# Patient Record
Sex: Female | Born: 1965 | Race: Black or African American | Hispanic: No | Marital: Married | Smoking: Current every day smoker
Health system: Southern US, Community
[De-identification: ages and names within clinical notes are randomized; demographics above are authoritative.]

## PROBLEM LIST (undated history)

## (undated) DIAGNOSIS — E119 Type 2 diabetes mellitus without complications: Secondary | ICD-10-CM

## (undated) DIAGNOSIS — I10 Essential (primary) hypertension: Secondary | ICD-10-CM

## (undated) HISTORY — DX: Type 2 diabetes mellitus without complications: E11.9

## (undated) HISTORY — PX: OTHER SURGICAL HISTORY: SHX169

---

## 2008-04-12 ENCOUNTER — Emergency Department (HOSPITAL_COMMUNITY): Admission: EM | Admit: 2008-04-12 | Discharge: 2008-04-12 | Payer: Self-pay | Admitting: Emergency Medicine

## 2008-11-06 ENCOUNTER — Emergency Department (HOSPITAL_COMMUNITY): Admission: EM | Admit: 2008-11-06 | Discharge: 2008-11-06 | Payer: Self-pay | Admitting: Emergency Medicine

## 2010-01-15 ENCOUNTER — Emergency Department (HOSPITAL_COMMUNITY): Admission: EM | Admit: 2010-01-15 | Discharge: 2010-01-15 | Payer: Self-pay | Admitting: Family Medicine

## 2010-05-03 ENCOUNTER — Other Ambulatory Visit: Payer: Self-pay | Admitting: Family Medicine

## 2010-05-03 DIAGNOSIS — Z1231 Encounter for screening mammogram for malignant neoplasm of breast: Secondary | ICD-10-CM

## 2010-05-10 ENCOUNTER — Ambulatory Visit: Payer: Self-pay

## 2010-05-18 ENCOUNTER — Ambulatory Visit
Admission: RE | Admit: 2010-05-18 | Discharge: 2010-05-18 | Disposition: A | Discharge status: Home / Self Care | Payer: BC Managed Care – PPO | Source: Ambulatory Visit | Attending: Family Medicine | Admitting: Family Medicine

## 2010-05-18 DIAGNOSIS — Z1231 Encounter for screening mammogram for malignant neoplasm of breast: Secondary | ICD-10-CM

## 2010-06-14 LAB — POCT I-STAT, CHEM 8
Calcium, Ion: 1.06 mmol/L — ABNORMAL LOW (ref 1.12–1.32)
Chloride: 109 mEq/L (ref 96–112)
HCT: 41 % (ref 36.0–46.0)
Hemoglobin: 13.9 g/dL (ref 12.0–15.0)
TCO2: 23 mmol/L (ref 0–100)

## 2010-06-14 LAB — POCT PREGNANCY, URINE: Preg Test, Ur: NEGATIVE

## 2010-10-17 ENCOUNTER — Encounter: Payer: Self-pay | Admitting: Internal Medicine

## 2010-10-17 DIAGNOSIS — Z0000 Encounter for general adult medical examination without abnormal findings: Secondary | ICD-10-CM | POA: Insufficient documentation

## 2010-10-18 ENCOUNTER — Ambulatory Visit: Payer: BC Managed Care – PPO | Admitting: Internal Medicine

## 2010-10-27 ENCOUNTER — Ambulatory Visit: Payer: BC Managed Care – PPO | Admitting: Internal Medicine

## 2010-10-27 DIAGNOSIS — Z029 Encounter for administrative examinations, unspecified: Secondary | ICD-10-CM

## 2011-02-23 ENCOUNTER — Other Ambulatory Visit (HOSPITAL_COMMUNITY): Payer: Self-pay | Admitting: Urology

## 2011-02-23 DIAGNOSIS — D497 Neoplasm of unspecified behavior of endocrine glands and other parts of nervous system: Secondary | ICD-10-CM

## 2011-03-02 ENCOUNTER — Ambulatory Visit (HOSPITAL_COMMUNITY)
Admission: RE | Admit: 2011-03-02 | Discharge: 2011-03-02 | Disposition: A | Discharge status: Home / Self Care | Payer: BC Managed Care – PPO | Source: Ambulatory Visit | Attending: Urology | Admitting: Urology

## 2011-03-02 DIAGNOSIS — D497 Neoplasm of unspecified behavior of endocrine glands and other parts of nervous system: Secondary | ICD-10-CM

## 2011-03-02 DIAGNOSIS — D3500 Benign neoplasm of unspecified adrenal gland: Secondary | ICD-10-CM | POA: Insufficient documentation

## 2011-03-02 MED ORDER — GADOBENATE DIMEGLUMINE 529 MG/ML IV SOLN
18.0000 mL | Freq: Once | INTRAVENOUS | Status: AC | PRN
  Administered 2011-03-02: 18 mL via INTRAVENOUS

## 2011-05-17 ENCOUNTER — Ambulatory Visit (INDEPENDENT_AMBULATORY_CARE_PROVIDER_SITE_OTHER): Payer: Managed Care, Other (non HMO) | Admitting: Family Medicine

## 2011-05-17 VITALS — BP 132/82 | HR 99 | Temp 98.6°F | Resp 16 | Ht 61.0 in | Wt 204.4 lb

## 2011-05-17 DIAGNOSIS — D2370 Other benign neoplasm of skin of unspecified lower limb, including hip: Secondary | ICD-10-CM

## 2011-05-17 DIAGNOSIS — L989 Disorder of the skin and subcutaneous tissue, unspecified: Secondary | ICD-10-CM

## 2011-05-17 MED ORDER — DOXYCYCLINE HYCLATE 100 MG PO TABS: 100.0000 mg | ORAL_TABLET | Freq: Two times a day (BID) | ORAL | Status: AC

## 2011-05-17 MED ORDER — TRIAMCINOLONE ACETONIDE 0.1 % EX CREA: TOPICAL_CREAM | Freq: Two times a day (BID) | CUTANEOUS | Status: AC

## 2011-05-17 NOTE — Progress Notes (Signed)
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  It has become painful and "puss-y".  O:  Crusty hyperpigmented irreg fissured lesion with mild surrounding erythema  A:  Possible varicosity, irritated with early cellulitis  P:  Doxy 100 bid Triamcinolone cr tid

## 2012-03-24 DIAGNOSIS — D3500 Benign neoplasm of unspecified adrenal gland: Secondary | ICD-10-CM | POA: Insufficient documentation

## 2012-03-24 DIAGNOSIS — F17200 Nicotine dependence, unspecified, uncomplicated: Secondary | ICD-10-CM | POA: Insufficient documentation

## 2012-04-24 ENCOUNTER — Other Ambulatory Visit: Payer: Self-pay | Admitting: Family Medicine

## 2012-04-24 DIAGNOSIS — Z1231 Encounter for screening mammogram for malignant neoplasm of breast: Secondary | ICD-10-CM

## 2012-05-23 ENCOUNTER — Ambulatory Visit: Payer: Managed Care, Other (non HMO)

## 2012-06-05 ENCOUNTER — Ambulatory Visit: Payer: Managed Care, Other (non HMO)

## 2012-08-14 DIAGNOSIS — E559 Vitamin D deficiency, unspecified: Secondary | ICD-10-CM | POA: Insufficient documentation

## 2012-09-06 ENCOUNTER — Ambulatory Visit
Admission: RE | Admit: 2012-09-06 | Discharge: 2012-09-06 | Disposition: A | Discharge status: Home / Self Care | Payer: 59 | Source: Ambulatory Visit | Attending: Family Medicine | Admitting: Family Medicine

## 2012-09-06 DIAGNOSIS — Z1231 Encounter for screening mammogram for malignant neoplasm of breast: Secondary | ICD-10-CM

## 2013-03-24 DIAGNOSIS — I10 Essential (primary) hypertension: Secondary | ICD-10-CM | POA: Insufficient documentation

## 2014-06-23 ENCOUNTER — Ambulatory Visit: Payer: Managed Care, Other (non HMO) | Admitting: Podiatry

## 2015-01-02 ENCOUNTER — Other Ambulatory Visit: Payer: Self-pay

## 2015-01-02 DIAGNOSIS — Z1231 Encounter for screening mammogram for malignant neoplasm of breast: Secondary | ICD-10-CM

## 2015-01-26 ENCOUNTER — Ambulatory Visit: Payer: Self-pay

## 2015-03-25 ENCOUNTER — Ambulatory Visit: Payer: Self-pay

## 2015-07-13 ENCOUNTER — Emergency Department (HOSPITAL_COMMUNITY)
Admission: EM | Admit: 2015-07-13 | Discharge: 2015-07-13 | Disposition: A | Discharge status: Home / Self Care | Payer: 59 | Attending: Emergency Medicine | Admitting: Emergency Medicine

## 2015-07-13 ENCOUNTER — Encounter (HOSPITAL_COMMUNITY): Payer: Self-pay | Admitting: Emergency Medicine

## 2015-07-13 DIAGNOSIS — H9202 Otalgia, left ear: Secondary | ICD-10-CM | POA: Insufficient documentation

## 2015-07-13 DIAGNOSIS — J029 Acute pharyngitis, unspecified: Secondary | ICD-10-CM | POA: Diagnosis present

## 2015-07-13 DIAGNOSIS — I10 Essential (primary) hypertension: Secondary | ICD-10-CM | POA: Insufficient documentation

## 2015-07-13 DIAGNOSIS — F17210 Nicotine dependence, cigarettes, uncomplicated: Secondary | ICD-10-CM | POA: Insufficient documentation

## 2015-07-13 HISTORY — DX: Essential (primary) hypertension: I10

## 2015-07-13 LAB — RAPID STREP SCREEN (MED CTR MEBANE ONLY): Streptococcus, Group A Screen (Direct): NEGATIVE

## 2015-07-13 MED ORDER — HYDROCODONE-ACETAMINOPHEN 7.5-325 MG/15ML PO SOLN: 15.0000 mL | Freq: Three times a day (TID) | ORAL | Status: DC | PRN

## 2015-07-13 MED ORDER — HYDROCODONE-ACETAMINOPHEN 7.5-325 MG/15ML PO SOLN
10.0000 mL | Freq: Once | ORAL | Status: AC
  Administered 2015-07-13: 10 mL via ORAL
  Filled 2015-07-13: qty 15

## 2015-07-13 NOTE — Discharge Instructions (Signed)
Take your medications as prescribed as needed for pain relief. He may also take 600 mg ibuprofen 4 times daily as needed for additional pain relief. Continue to drink at least six 8 ounce glasses of water daily to remain hydrated at home. Follow-up with your primary care provider in the next 5 days if her pain has not improved. Please return to the Emergency Department if symptoms worsen or new onset of uncontrollable fever, facial/neck swelling, unable to swallow resulting in drooling, difficulty breathing, muffled voice, unable to open jaw completely, neck stiffness.

## 2015-07-13 NOTE — ED Notes (Signed)
Pt states she is been having left ear ache and sore throat for the past few days, very hard to swallow and to speech. Pt denies any fever at this time.

## 2015-07-13 NOTE — ED Provider Notes (Signed)
CSN: [id number]     Arrival date & time 07/13/15  2044 History  By signing my name below, I, Dora Sims, attest that this documentation has been prepared under the direction and in the presence of non-physician practitioner, Harlene Ramus, PA-C. Electronically Signed: Dora Sims, Scribe. 07/13/2015. 9:19 PM.   Chief Complaint  Patient presents with  . Otalgia  . Sore Throat    The history is provided by the patient. No language interpreter was used.     HPI Comments: Samantha Rice is a 50 y.o. female with h/o HTN who presents to the Emergency Department complaining of sudden onset, constant, worsening, severe sore throat for the last week. She states that her sore throat has worsened significantly over the last three days. She notes associated left otalgia, intermittent sinus pressure, and dry cough since onset as well. She endorses pain exacerbation with swallowing, speaking, and opening of her mouth; pt is able to swallow and has not been spitting up saliva due to pain with swallowing. Pt states that she scheduled an appointment with her PCP but could not wait because her symptoms worsened. She does not believe that she has sick contacts with similar symptoms; she works at [employer]. She has been taking Tylenol with no relief. Pt denies SOB, fever, dental pain, headache, lightheadedness, dizziness, visual changes, left ear drainage, difficulty hearing, chest pain, wheezing, nausea, vomiting, rhinorrhea, congestion, facial/neck swelling, neck stiffness, or any other associated symptoms. Pt did not take her medication for HTN today because of pain exacerbation with swallowing.  Past Medical History  Diagnosis Date  . Hypertension    Past Surgical History  Procedure Laterality Date  . C cection     History reviewed. No pertinent family history. Social History  Substance Use Topics  . Smoking status: Current Every Day Smoker -- 0.50 packs/day    Types: Cigarettes  . Smokeless tobacco:  None  . Alcohol Use: No   OB History    No data available     Review of Systems  Constitutional: Negative for fever.  HENT: Positive for ear pain, sinus pressure, sore throat and trouble swallowing. Negative for congestion, dental problem, ear discharge, facial swelling, hearing loss and rhinorrhea.   Eyes: Negative for visual disturbance.  Respiratory: Positive for cough. Negative for shortness of breath and wheezing.   Cardiovascular: Negative for chest pain.  Gastrointestinal: Negative for nausea and vomiting.  Musculoskeletal: Negative for neck pain.  Neurological: Negative for dizziness, light-headedness and headaches.    Allergies  Naproxen  Home Medications   Prior to Admission medications   Medication Sig Start Date End Date Taking? Authorizing Provider  HYDROcodone-acetaminophen (HYCET) 7.5-325 mg/15 ml solution Take 15 mLs by mouth every 8 (eight) hours as needed for moderate pain. 07/13/15   Chesley Noon Nadeau, PA-C   BP 175/95 mmHg  Pulse 119  Temp(Src) 99.5 F (37.5 C) (Oral)  Resp 20  Ht 5\' 1"  (1.549 m)  Wt 86.183 kg  BMI 35.92 kg/m2  SpO2 100%  LMP 03/14/2014 Physical Exam  Constitutional: She is oriented to person, place, and time. She appears well-developed and well-nourished.  HENT:  Head: Normocephalic and atraumatic.  Right Ear: Tympanic membrane normal.  Left Ear: Tympanic membrane normal. No drainage, swelling or tenderness. No mastoid tenderness. No decreased hearing is noted.  Nose: Nose normal. Right sinus exhibits no maxillary sinus tenderness and no frontal sinus tenderness. Left sinus exhibits no maxillary sinus tenderness and no frontal sinus tenderness.  Mouth/Throat: Uvula is midline and  mucous membranes are normal. No trismus in the jaw. Normal dentition. No dental abscesses or uvula swelling. Oropharyngeal exudate (white exudate at bilateral tonsils), posterior oropharyngeal edema and posterior oropharyngeal erythema present. No  tonsillar abscesses.  No facial or neck swelling.  Eyes: Conjunctivae and EOM are normal. Right eye exhibits no discharge. Left eye exhibits no discharge. No scleral icterus.  Neck: Normal range of motion. Neck supple.  Cardiovascular: Regular rhythm, normal heart sounds and intact distal pulses.   Tachycardic heart rate: 119.  Pulmonary/Chest: Effort normal and breath sounds normal. No stridor. No respiratory distress. She has no wheezes. She has no rales. She exhibits no tenderness.  Lymphadenopathy:    She has cervical adenopathy (left submandibular tender to palpation).  Neurological: She is alert and oriented to person, place, and time.  Nursing note and vitals reviewed.   ED Course  Procedures (including critical care time)  DIAGNOSTIC STUDIES: Oxygen Saturation is 100% on RA, normal by my interpretation.    COORDINATION OF CARE: 9:19 PM Discussed treatment plan with pt at bedside and pt agreed to plan.  Labs Review Labs Reviewed  RAPID STREP SCREEN (NOT AT [facility])  CULTURE, GROUP A STREP [facility])    Imaging Review No results found. I have personally reviewed and evaluated these images and lab results as part of my medical decision-making.   EKG Interpretation None      MDM   Final diagnoses:  Pharyngitis    Pt afebrile with tonsillar exudate, cervical lymphadenopathy, & dysphagia; negative strep. I suspect patient's symptoms are likely due to viral pharyngitis. Treated in the Ed with Pain medication. Initial vitals, HR 119, afebrile, BP 175/95. Pt appears well hydrated, however discussed importance of water rehydration. Presentation non concerning for PTA or infxn spread to soft tissue. No trismus or uvula deviation. Specific return precautions discussed. Pt able to drink water in ED without difficulty with intact air way. Repeat vitals, HR palpated at 100. Pt reports her pain has improved. Plan to discharge patient home with symptomatic treatment including pain meds.  Recommended PCP follow up in 3-5 days if pain has not improved. Discussed return precautions with patient. Patient reports understanding and agreement of plan.   Patients blood pressure 175/95 on initial evaluation. Patient reports history of hypertension and denies taking her medications this morning. Patient denies headache, visual changes, lightheadedness, dizziness, shortness of breath, chest pain, abdominal pain, numbness, tingling, weakness. No signs of hypertensive emergency or urgency at this time. Advised patient to take her medications after being discharged from the ED today. Advised patient to follow up with her PCP in one week to have her blood pressure rechecked.   I personally performed the services described in this documentation, which was scribed in my presence. The recorded information has been reviewed and is accurate.   Chesley Noon [place], [place] 07/13/15 2259  Leonard Schwartz, MD 07/15/15 [phone]

## 2015-07-16 LAB — CULTURE, GROUP A STREP (THRC)

## 2016-02-15 DIAGNOSIS — M545 Low back pain, unspecified: Secondary | ICD-10-CM | POA: Insufficient documentation

## 2016-02-22 ENCOUNTER — Ambulatory Visit: Payer: 59 | Admitting: Physical Therapy

## 2016-02-23 ENCOUNTER — Ambulatory Visit: Payer: 59 | Attending: Family Medicine | Admitting: Physical Therapy

## 2016-02-23 DIAGNOSIS — R262 Difficulty in walking, not elsewhere classified: Secondary | ICD-10-CM | POA: Insufficient documentation

## 2016-02-23 DIAGNOSIS — M62830 Muscle spasm of back: Secondary | ICD-10-CM | POA: Diagnosis present

## 2016-02-23 DIAGNOSIS — M545 Low back pain, unspecified: Secondary | ICD-10-CM

## 2016-02-23 DIAGNOSIS — M25612 Stiffness of left shoulder, not elsewhere classified: Secondary | ICD-10-CM | POA: Insufficient documentation

## 2016-02-23 DIAGNOSIS — M25512 Pain in left shoulder: Secondary | ICD-10-CM | POA: Insufficient documentation

## 2016-02-23 DIAGNOSIS — G8929 Other chronic pain: Secondary | ICD-10-CM | POA: Diagnosis present

## 2016-02-23 NOTE — Therapy (Signed)
[address] [place], [place], [address] Phone: [phone]   Fax:  [phone]  Physical Therapy Evaluation  Patient Details  Name: Samantha Rice MRN: [id number] Date of Birth: [birth year] No Data Recorded  Encounter Date: 02/23/2016      PT End of Session - 02/23/16 1142    Visit Number 1   Date for PT Re-Evaluation 05/20/16   PT Start Time 1100   PT Stop Time 1155   PT Time Calculation (min) 55 min   Activity Tolerance Patient limited by pain   Behavior During Therapy Anxious      Past Medical History:  Diagnosis Date  . Hypertension     Past Surgical History:  Procedure Laterality Date  . c cection      There were no vitals filed for this visit.       Subjective Assessment - 02/23/16 1101    Subjective Pt reports that she has been experiencing low back pain for about 6 months. She also reports that the left shoulder has also ben huritng her for about 6 months. She can not recall a specific evenet that caused the pain. She reports that Xrays were negative on both body regions. She states that the pain is worse in the evening. She states that she wakes up with pain some nights. Last night she woke up twice due to the shoulder pain.    Limitations Sitting;Standing;House hold activities  washing dishes   How long can you sit comfortably? 15 minutes   How long can you stand comfortably? 30 minutes   How long can you walk comfortably? varies   Diagnostic tests Xrays- negative   Patient Stated Goals managing pain   Currently in Pain? Yes   Pain Score 4    Pain Location Back   Pain Orientation Right;Left;Lower   Pain Descriptors / Indicators Aching;Sharp   Pain Type Chronic pain   Pain Onset More than a month ago   Pain Frequency Intermittent   Aggravating Factors  sitting for long periods, standing/walking for long periods,    Pain Relieving Factors taking BCs or Goody powder   Multiple  Pain Sites Yes   Pain Score 6   Pain Location Shoulder   Pain Orientation Left;Anterior;Upper   Pain Descriptors / Indicators Sore;Tightness   Pain Type Chronic pain   Pain Onset More than a month ago   Pain Frequency Constant   Aggravating Factors  any type of motion causes an increase in pain, trying to open things with the left hand   Pain Relieving Factors nothing   Effect of Pain on Daily Activities normally uses right hand to avoid pain on left            [facility] PT Assessment - 02/23/16 0001      Assessment   Medical Diagnosis Dr. Luciana Axe   Hand Dominance Right   Next MD Visit Jan. 2   Prior Therapy no     Balance Screen   Has the patient fallen in the past 6 months No   Has the patient had a decrease in activity level because of a fear of falling?  No   Is the patient reluctant to leave their home because of a fear of falling?  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   Lumbar Extension WFL, P!   Lumbar - Right Side [facility], P!   Lumbar - Left Side Bend WFL, P!   Lumbar - Right Rotation WFL, P!   Lumbar - Left Rotation WFL, P!     Strength   Overall Strength Deficits                   [facility] Adult PT Treatment/Exercise - 02/23/16 0001      Modalities   Modalities Electrical Stimulation;Moist Heat     Moist Heat Therapy   Number Minutes Moist Heat 15 Minutes   Moist Heat Location Lumbar Spine;Shoulder     Electrical  Stimulation   Electrical Stimulation Location low back   Electrical Stimulation Action IFC   Electrical Stimulation Parameters supine   Electrical Stimulation Goals Pain                PT Education - 02/23/16 1142    Education provided No          PT Short Term Goals - 02/23/16 1200      PT SHORT TERM GOAL #1   Title Pt will demnstrate proper recall of HEP.   Time 2   Period Weeks   Status New           PT Long Term Goals - 02/23/16 1200      PT LONG TERM GOAL #1   Title Pt will be able to sleep through the night wihtout waking up due to pain.   Time 8   Period Weeks   Status New     PT LONG TERM GOAL #2   Title Pt will report 50% decrease in pain in the low back.   Time 8   Period Weeks   Status New     PT LONG TERM GOAL #3   Title Pt will be able to tolerate therapeutic exercise for an entire treatment session without increased pain.   Time 8   Period Weeks   Status New               Plan - 02/23/16 1144    Clinical Impression Statement Pt presents with pain in the low back and the left shoulder. Although the patient is able to move through motions of the low back without much limitation, pt reports pain with all spinal movements. Pt also presents with left shoulder pain with limted AROM due to pain. Palpation is severely limited due to pain and fear of possible pain. Pt seems very anxious and the slightest palpation causes a reactionary jump. Pt reports a "spasm" in the low back and after the movements were performed, the pt was very hesitant and expressed that she was in pain. A true strength assessment was not able to be measured due to pain or fear of. Pt did not report much relief at all with e-stim and heat modalities. Pt was told that this eval report will be sent to Dr. Luciana Axe.    Rehab Potential Fair   PT Frequency 2x / week   PT  Duration 8 weeks   PT Treatment/Interventions ADLs/Self Care Home Management;Electrical Stimulation;Moist  Heat;Cryotherapy;Traction;Ultrasound;Functional mobility training;Gait training;Neuromuscular re-education;Balance training;Therapeutic exercise;Therapeutic activities;Patient/family education;Manual techniques;Passive range of motion   PT Next Visit Plan Pain management   Consulted and Agree with Plan of Care Patient      Patient will benefit from skilled therapeutic intervention in order to improve the following deficits and impairments:  Decreased activity tolerance, Decreased mobility, Decreased endurance, Decreased coordination, Decreased range of motion, Decreased strength, Pain, Postural dysfunction, Obesity, Difficulty walking, Hypomobility, Improper body mechanics  Visit Diagnosis: Chronic left shoulder pain  Stiffness of left shoulder, not elsewhere classified  Chronic bilateral low back pain without sciatica  Difficulty in walking, not elsewhere classified  Muscle spasm of back     Problem List Patient Active Problem List   Diagnosis Date Noted  . Preventative health care 10/17/2010    Toy Baker, SPT 02/23/2016, 12:03 PM  [address] [place], [place], [address] Phone: [phone]   Fax:  [phone]  Name: Tinsley Makos MRN: [id number] Date of Birth: [birth year]

## 2016-02-29 ENCOUNTER — Encounter: Payer: Self-pay | Admitting: Physical Therapy

## 2016-02-29 ENCOUNTER — Ambulatory Visit: Payer: 59 | Attending: Family Medicine | Admitting: Physical Therapy

## 2016-02-29 DIAGNOSIS — M545 Low back pain, unspecified: Secondary | ICD-10-CM

## 2016-02-29 DIAGNOSIS — M25512 Pain in left shoulder: Secondary | ICD-10-CM | POA: Diagnosis not present

## 2016-02-29 DIAGNOSIS — G8929 Other chronic pain: Secondary | ICD-10-CM | POA: Insufficient documentation

## 2016-02-29 DIAGNOSIS — M25612 Stiffness of left shoulder, not elsewhere classified: Secondary | ICD-10-CM | POA: Diagnosis present

## 2016-02-29 DIAGNOSIS — R262 Difficulty in walking, not elsewhere classified: Secondary | ICD-10-CM | POA: Insufficient documentation

## 2016-02-29 DIAGNOSIS — M62830 Muscle spasm of back: Secondary | ICD-10-CM | POA: Insufficient documentation

## 2016-02-29 NOTE — Therapy (Signed)
[address] [place], [place], [address] Phone: [phone]   Fax:  [phone]  Physical Therapy Treatment  Patient Details  Name: Samantha Rice MRN: [id number] Date of Birth: 05-18-1965 No Data Recorded  Encounter Date: 02/29/2016      PT End of Session - 02/29/16 0921    Visit Number 2   Date for PT Re-Evaluation 05/20/16   PT Start Time [id number]   PT Stop Time 0940   PT Time Calculation (min) 45 min      Past Medical History:  Diagnosis Date  . Hypertension     Past Surgical History:  Procedure Laterality Date  . c cection      There were no vitals filed for this visit.      Subjective Assessment - 02/29/16 0855    Subjective 10 min late. same as eval   Currently in Pain? Yes   Pain Score 4    Pain Location Back   Multiple Pain Sites Yes   Pain Score 4   Pain Location Shoulder   Pain Orientation Left            [facility] PT Assessment - 02/29/16 0001      AROM   AROM Assessment Site Shoulder   Right/Left Shoulder Left   Left Shoulder Flexion 155 Degrees  after her ex                     [facility] Adult PT Treatment/Exercise - 02/29/16 0001      Exercises   Exercises Shoulder;Neck;Lumbar     Lumbar Exercises: Aerobic   Stationary Bike Nustep L 4 5 min     Lumbar Exercises: Supine   Ab Set 10 reps;3 seconds   Bridge 10 reps  with ball and 10 KTC and obl     Shoulder Exercises: Supine   Other Supine Exercises cane ex chest press, flex and abd 10 each     Shoulder Exercises: Standing   Horizontal ABduction Strengthening;Both;10 reps;Theraband   Theraband Level (Shoulder Horizontal ABduction) Level 1 (Yellow)   External Rotation Strengthening;Both;10 reps;Theraband   Theraband Level (Shoulder External Rotation) Level 1 (Yellow)   Extension Strengthening;Both;10 reps;Theraband   Theraband Level (Shoulder Extension) Level 1 (Yellow)   Row Strengthening;Both;10  reps;Theraband   Theraband Level (Shoulder Row) Level 1 (Yellow)   Other Standing Exercises finger ladder flex and abd 5 times each     Modalities   Modalities Electrical Stimulation;Moist Heat     Moist Heat Therapy   Number Minutes Moist Heat 15 Minutes   Moist Heat Location Lumbar Spine;Shoulder     Electrical Stimulation   Electrical Stimulation Location low back   Electrical Stimulation Action IFC   Electrical Stimulation Parameters sitting   Electrical Stimulation Goals Pain                  PT Short Term Goals - 02/23/16 1200      PT SHORT TERM GOAL #1   Title Pt will demnstrate proper recall of HEP.   Time 2   Period Weeks   Status New           PT Long Term Goals - 02/23/16 1200      PT LONG TERM GOAL #1   Title Pt will be able to sleep through the night wihtout waking up due to pain.   Time 8   Period Weeks   Status New  PT LONG TERM GOAL #2   Title Pt will report 50% decrease in pain in the low back.   Time 8   Period Weeks   Status New     PT LONG TERM GOAL #3   Title Pt will be able to tolerate therapeutic exercise for an entire treatment session without increased pain.   Time 8   Period Weeks   Status New               Plan - 02/29/16 [id number]    Clinical Impression Statement pt tolerated ther ex fair- needed cuing for correct tech, relax muscles for better tech and not hold breathe. pt very quarded and limited ROM. Pt verb she DID not like estom so did heat only in sitting at end of session.   PT Next Visit Plan Pain management, assess todays tx and ISSUE HEP      Patient will benefit from skilled therapeutic intervention in order to improve the following deficits and impairments:  Decreased activity tolerance, Decreased mobility, Decreased endurance, Decreased coordination, Decreased range of motion, Decreased strength, Pain, Postural dysfunction, Obesity, Difficulty walking, Hypomobility, Improper body mechanics  Visit  Diagnosis: Chronic left shoulder pain  Stiffness of left shoulder, not elsewhere classified  Chronic bilateral low back pain without sciatica  Muscle spasm of back     Problem List Patient Active Problem List   Diagnosis Date Noted  . Preventative health care 10/17/2010    Samantha Rice,ANGIE PTA 02/29/2016, 9:25 AM  [address] [place], [place], [address] Phone: [phone]   Fax:  [phone]  Name: Samantha Rice MRN: [id number] Date of Birth: [birth year]

## 2016-03-07 ENCOUNTER — Ambulatory Visit: Payer: 59 | Admitting: Physical Therapy

## 2016-03-09 ENCOUNTER — Ambulatory Visit: Payer: 59 | Admitting: Physical Therapy

## 2016-03-09 ENCOUNTER — Encounter: Payer: Self-pay | Admitting: Physical Therapy

## 2016-03-09 DIAGNOSIS — M25512 Pain in left shoulder: Principal | ICD-10-CM

## 2016-03-09 DIAGNOSIS — M25612 Stiffness of left shoulder, not elsewhere classified: Secondary | ICD-10-CM

## 2016-03-09 DIAGNOSIS — G8929 Other chronic pain: Secondary | ICD-10-CM

## 2016-03-09 NOTE — Therapy (Signed)
[address] [place], [place], [address] Phone: [phone]   Fax:  [phone]  Physical Therapy Treatment  Patient Details  Name: Samantha Rice MRN: [id number] Date of Birth: Sep 05, 1965 No Data Recorded  Encounter Date: 03/09/2016      PT End of Session - 03/09/16 0832    Visit Number 3   Date for PT Re-Evaluation 05/20/16   PT Start Time 0800   PT Stop Time 0835   PT Time Calculation (min) 35 min      Past Medical History:  Diagnosis Date  . Hypertension     Past Surgical History:  Procedure Laterality Date  . c cection      There were no vitals filed for this visit.      Subjective Assessment - 03/09/16 0805    Subjective maybe some better, i think- just been dealing with this so long   Currently in Pain? Yes   Pain Score 3    Pain Location Shoulder   Pain Orientation Left            [facility] PT Assessment - 03/09/16 0001      AROM   AROM Assessment Site Shoulder   Right/Left Shoulder Left  standing   Left Shoulder Flexion 155 Degrees   Left Shoulder ABduction 160 Degrees   Left Shoulder Internal Rotation 50 Degrees  at 90 abd   Left Shoulder External Rotation 90 Degrees  at 90 abd                     [facility] Adult PT Treatment/Exercise - 03/09/16 0001      Neck Exercises: Standing   Other Standing Exercises ball vs wall 5 times CC and CCW     Lumbar Exercises: Aerobic   Stationary Bike Nustep L 4 6 min                PT Education - 03/09/16 0823    Education provided Yes   Education Details cerv/pec and shld stretches,red tband scap stab   Person(s) Educated Patient   Methods Explanation;Demonstration;Handout;Verbal cues   Comprehension Verbalized understanding;Returned demonstration          PT Short Term Goals - 03/09/16 0832      PT SHORT TERM GOAL #1   Title Pt will demnstrate proper recall of HEP.   Status Achieved            PT Long Term Goals - 03/09/16 6962      PT LONG TERM GOAL #1   Title Pt will be able to sleep through the night wihtout waking up due to pain.   Status On-going     PT LONG TERM GOAL #2   Title Pt will report 50% decrease in pain in the low back.   Status On-going     PT LONG TERM GOAL #3   Title Pt will be able to tolerate therapeutic exercise for an entire treatment session without increased pain.   Status On-going               Plan - 03/09/16 9528    Clinical Impression Statement STG met and progressing with LTG. Pt moving better than last session, cuing with ex to not be so guarded and relax shlds. very tight trap and pecs. Issued HEP   PT Next Visit Plan progress postural ex and shld ROM      Patient will benefit from skilled therapeutic  intervention in order to improve the following deficits and impairments:  Decreased activity tolerance, Decreased mobility, Decreased endurance, Decreased coordination, Decreased range of motion, Decreased strength, Pain, Postural dysfunction, Obesity, Difficulty walking, Hypomobility, Improper body mechanics  Visit Diagnosis: Chronic left shoulder pain  Stiffness of left shoulder, not elsewhere classified     Problem List Patient Active Problem List   Diagnosis Date Noted  . Preventative health care 10/17/2010    Shalea Tomczak,ANGIE PTA 03/09/2016, 8:33 AM  [address] [place], [place], [address] Phone: [phone]   Fax:  [phone]  Name: Samantha Rice MRN: [id number] Date of Birth: [birth year]

## 2016-03-10 ENCOUNTER — Ambulatory Visit: Payer: 59 | Admitting: Physical Therapy

## 2016-03-14 ENCOUNTER — Ambulatory Visit: Payer: 59 | Admitting: Physical Therapy

## 2016-03-14 ENCOUNTER — Encounter: Payer: Self-pay | Admitting: Physical Therapy

## 2016-03-14 DIAGNOSIS — R262 Difficulty in walking, not elsewhere classified: Secondary | ICD-10-CM

## 2016-03-14 DIAGNOSIS — M25512 Pain in left shoulder: Secondary | ICD-10-CM | POA: Diagnosis not present

## 2016-03-14 DIAGNOSIS — M545 Low back pain: Secondary | ICD-10-CM

## 2016-03-14 DIAGNOSIS — G8929 Other chronic pain: Secondary | ICD-10-CM

## 2016-03-14 DIAGNOSIS — M25612 Stiffness of left shoulder, not elsewhere classified: Secondary | ICD-10-CM

## 2016-03-14 DIAGNOSIS — M62830 Muscle spasm of back: Secondary | ICD-10-CM

## 2016-03-14 NOTE — Therapy (Addendum)
[place], [place], [address] Phone: [phone]   Fax:  [phone]  Physical Therapy Treatment  Patient Details  Name: Samantha Rice MRN: [id number] Date of Birth: [birth year] No Data Recorded  Encounter Date: 03/14/2016      PT End of Session - 03/14/16 1220    Visit Number 4   Date for PT Re-Evaluation 05/20/16   PT Start Time 3664   PT Stop Time 1220   PT Time Calculation (min) 35 min   Activity Tolerance Patient tolerated treatment well   Behavior During Therapy [facility] for tasks assessed/performed      Past Medical History:  Diagnosis Date  . Hypertension     Past Surgical History:  Procedure Laterality Date  . c cection      There were no vitals filed for this visit.      Subjective Assessment - 03/14/16 1150    Subjective Pt reports some improvement, She reports some pain but she thinks it is due to her returning to work   Currently in Pain? Yes   Pain Score 4    Pain Location Shoulder                         [facility] Adult PT Treatment/Exercise - 03/14/16 0001      Lumbar Exercises: Aerobic   Stationary Bike Nustep L 4 6 min     Lumbar Exercises: Machines for Strengthening   Other Lumbar Machine Exercise Rows Lats 25lb 2x10     Lumbar Exercises: Standing   Row Both;10 reps;Theraband  x2   Theraband Level (Row) Level 2 (Red)   Shoulder Extension 10 reps;Theraband;Both  x2   Theraband Level (Shoulder Extension) Level 2 (Red)   Other Standing Lumbar Exercises OHP with yellow ball 2x10                  PT Short Term Goals - 03/09/16 4034      PT SHORT TERM GOAL #1   Title Pt will demnstrate proper recall of HEP.   Status Achieved           PT Long Term Goals - 03/14/16 1221      PT LONG TERM GOAL #1   Title Pt will be able to sleep through the night wihtout waking up due to pain.   Status On-going     PT LONG TERM GOAL #2   Title Pt will report 50% decrease in pain in the low back.   Status Partially Met     PT LONG TERM GOAL #3   Title Pt will be able to tolerate therapeutic exercise for an entire treatment session without increased pain.   Status Partially Met               Plan - 03/14/16 1220    Clinical Impression Statement Pt tolerated all of today's exercises well with no reports of increase pain. Pt reports she's motivated to get better   Rehab Potential Fair   PT Frequency 2x / week   PT Duration 8 weeks   PT Treatment/Interventions ADLs/Self Care Home Management;Electrical Stimulation;Moist Heat;Cryotherapy;Traction;Ultrasound;Functional mobility training;Gait training;Neuromuscular re-education;Balance training;Therapeutic exercise;Therapeutic activities;Patient/family education;Manual techniques;Passive range of motion   PT Next Visit Plan progress postural ex and shld ROM      Patient will benefit from skilled therapeutic intervention in order to improve the following deficits and impairments:  Decreased activity tolerance, Decreased mobility,  Decreased endurance, Decreased coordination, Decreased range of motion, Decreased strength, Pain, Postural dysfunction, Obesity, Difficulty walking, Hypomobility, Improper body mechanics  Visit Diagnosis: Chronic left shoulder pain  Stiffness of left shoulder, not elsewhere classified  Chronic bilateral low back pain without sciatica  Difficulty in walking, not elsewhere classified  Muscle spasm of back     Problem List Patient Active Problem List   Diagnosis Date Noted  . Preventative health care 10/17/2010    PHYSICAL THERAPY DISCHARGE SUMMARY  Visits from Start of Care: 4  Plan: Patient agrees to discharge.  Patient goals were partially met. Patient is being discharged due to being pleased with the current functional level.  ?????       Scot Jun, PTA 03/14/2016, 12:22 PM  [place], [place], [address] Phone: [phone]   Fax:  [phone]  Name: Deanette Tullius MRN: [id number] Date of Birth: April 25, 1965

## 2016-03-17 ENCOUNTER — Ambulatory Visit: Payer: 59 | Admitting: Physical Therapy

## 2016-07-26 DIAGNOSIS — E7800 Pure hypercholesterolemia, unspecified: Secondary | ICD-10-CM | POA: Insufficient documentation

## 2017-11-29 ENCOUNTER — Emergency Department (HOSPITAL_COMMUNITY)
Admission: EM | Admit: 2017-11-29 | Discharge: 2017-11-29 | Disposition: A | Discharge status: Home / Self Care | Payer: Self-pay | Attending: Emergency Medicine | Admitting: Emergency Medicine

## 2017-11-29 ENCOUNTER — Encounter (HOSPITAL_COMMUNITY): Payer: Self-pay | Admitting: Emergency Medicine

## 2017-11-29 ENCOUNTER — Other Ambulatory Visit: Payer: Self-pay

## 2017-11-29 DIAGNOSIS — L02411 Cutaneous abscess of right axilla: Secondary | ICD-10-CM | POA: Insufficient documentation

## 2017-11-29 DIAGNOSIS — F17210 Nicotine dependence, cigarettes, uncomplicated: Secondary | ICD-10-CM | POA: Insufficient documentation

## 2017-11-29 MED ORDER — IBUPROFEN 800 MG PO TABS: 800.0000 mg | ORAL_TABLET | Freq: Three times a day (TID) | ORAL | 0 refills | Status: DC

## 2017-11-29 MED ORDER — PROBIOTIC 250 MG PO CAPS: 1.0000 | ORAL_CAPSULE | Freq: Every day | ORAL | 0 refills | Status: DC

## 2017-11-29 MED ORDER — CLINDAMYCIN HCL 150 MG PO CAPS: 450.0000 mg | ORAL_CAPSULE | Freq: Three times a day (TID) | ORAL | 0 refills | Status: DC

## 2017-11-29 NOTE — ED Triage Notes (Signed)
Pt with abscess to R axilla x several days. Pt states it started draining last night but remains painful.

## 2017-11-29 NOTE — Discharge Instructions (Addendum)
Take clindamycin until completed.  Take a probiotic with this medication daily to help prevent stomach upset.  Make sure to take clindamycin with food.  Take ibuprofen and Tylenol as prescribed over-the-counter, as needed for your pain.  We should take the ibuprofen with food as well.  Continue warm compresses to help continue drainage.  I recommend returning to the emergency department or see your doctor in 2 days for recheck.  Please return sooner if you develop any increasing pain, redness, swelling, red streaking from the area, or fevers over 100.4.

## 2017-11-29 NOTE — ED Provider Notes (Signed)
[facility] DEPT Provider Note   CSN: [id number] Arrival date & time: 11/29/17  1027     History   Chief Complaint Chief Complaint  Patient presents with  . Abscess    R axilla    HPI Samantha Rice is a 52 y.o. female with history of hypertension who presents with a one-week history of pain, swelling, drainage from right axilla.  Patient reports the area began draining a few days ago and continues to drain after eating.  She denies any fevers or other symptoms.  She has no history of abscess.  She is not diabetic.  She has been taking Tylenol at home for pain.  HPI  Past Medical History:  Diagnosis Date  . Hypertension     Patient Active Problem List   Diagnosis Date Noted  . Preventative health care 10/17/2010    Past Surgical History:  Procedure Laterality Date  . c cection       OB History   None      Home Medications    Prior to Admission medications   Medication Sig Start Date End Date Taking? Authorizing Provider  clindamycin (CLEOCIN) 150 MG capsule Take 3 capsules (450 mg total) by mouth 3 (three) times daily. 11/29/17   Tay Whitwell, Bea Graff, PA-C  HYDROcodone-acetaminophen (HYCET) 7.5-325 mg/15 ml solution Take 15 mLs by mouth every 8 (eight) hours as needed for moderate pain. 07/13/15   Nona Dell, PA-C  ibuprofen (ADVIL,MOTRIN) 800 MG tablet Take 1 tablet (800 mg total) by mouth 3 (three) times daily. 11/29/17   Marcellus Pulliam, Bea Graff, PA-C  Saccharomyces boulardii (PROBIOTIC) 250 MG CAPS Take 1 capsule by mouth daily. 11/29/17   Frederica Kuster, PA-C    Family History History reviewed. No pertinent family history.  Social History Social History   Tobacco Use  . Smoking status: Current Every Day Smoker    Packs/day: 0.50    Types: Cigarettes  Substance Use Topics  . Alcohol use: No  . Drug use: No     Allergies   Naproxen   Review of Systems Review of Systems  Constitutional: Negative for fever.    Skin: Positive for wound.     Physical Exam Updated Vital Signs BP (!) 170/95 (BP Location: Left Arm)   Pulse 93   Temp 98.2 F (36.8 C) (Oral)   Resp 16   LMP  (Exact Date)   SpO2 96%   Physical Exam  Constitutional: She appears well-developed and well-nourished. No distress.  HENT:  Head: Normocephalic and atraumatic.  Mouth/Throat: Oropharynx is clear and moist. No oropharyngeal exudate.  Eyes: Pupils are equal, round, and reactive to light. Conjunctivae are normal. Right eye exhibits no discharge. Left eye exhibits no discharge. No scleral icterus.  Neck: Normal range of motion. Neck supple. No thyromegaly present.  Cardiovascular: Normal rate, regular rhythm, normal heart sounds and intact distal pulses. Exam reveals no gallop and no friction rub.  No murmur heard. Pulmonary/Chest: Effort normal and breath sounds normal. No stridor. No respiratory distress. She has no wheezes. She has no rales.  Abdominal: Soft. Bowel sounds are normal. She exhibits no distension. There is no tenderness. There is no rebound and no guarding.  Musculoskeletal: She exhibits no edema.  Lymphadenopathy:    She has no cervical adenopathy.  Neurological: She is alert. Coordination normal.  Skin: Skin is warm and dry. No rash noted. She is not diaphoretic. No pallor.  Swelling and pain to right axilla with 1 x  2 cm induration without fluctuance, central opening with drainage  Psychiatric: She has a normal mood and affect.  Nursing note and vitals reviewed.    ED Treatments / Results  Labs (all labs ordered are listed, but only abnormal results are displayed) Labs Reviewed - No data to display  EKG None  Radiology No results found.  Procedures Procedures (including critical care time)  EMERGENCY DEPARTMENT US SOFT TISSUE INTERPRETATION "Study: Limited Soft Tissue Ultrasound"  INDICATIONS: Soft tissue infection Multiple views of the body part were obtained in real-time with a  multi-frequency linear probe  PERFORMED BY: Myself IMAGES ARCHIVED?: Yes SIDE:Right  BODY PART:Axilla INTERPRETATION:  No abcess noted and Cellulitis present     Medications Ordered in ED Medications - No data to display   Initial Impression / Assessment and Plan / ED Course  I have reviewed the triage vital signs and the nursing notes.  Pertinent labs & imaging results that were available during my care of the patient were reviewed by me and considered in my medical decision making (see chart for details).     Patient with actively draining right axillary abscess.  There is active drainage and opening.  There is no fluctuance on exam.  No fluid collections seen on ultrasound.  Will treat with clindamycin, probiotic, and ibuprofen for pain control.  Continue Tylenol at home.  Recheck in 2 days.  Return precautions discussed.  Patient understands and agrees with plan.  Patient vitals stable throughout ED course and discharged in satisfactory condition.  Patient's blood pressure noted to be elevated, however suspect due to pain.  Follow-up to PCP for recheck of blood pressure.  Final Clinical Impressions(s) / ED Diagnoses   Final diagnoses:  Abscess of axilla, right    ED Discharge Orders         Ordered    clindamycin (CLEOCIN) 150 MG capsule  3 times daily     11/29/17 1024    Saccharomyces boulardii (PROBIOTIC) 250 MG CAPS  Daily     11/29/17 1024    ibuprofen (ADVIL,MOTRIN) 800 MG tablet  3 times daily     11/29/17 [address] PA-C 11/29/17 1029    Gareth Morgan, MD 11/30/17 2208

## 2018-01-23 ENCOUNTER — Encounter (HOSPITAL_COMMUNITY): Payer: Self-pay | Admitting: Emergency Medicine

## 2018-01-23 ENCOUNTER — Emergency Department (HOSPITAL_COMMUNITY)
Admission: EM | Admit: 2018-01-23 | Discharge: 2018-01-23 | Disposition: A | Discharge status: Home / Self Care | Payer: Self-pay | Attending: Emergency Medicine | Admitting: Emergency Medicine

## 2018-01-23 ENCOUNTER — Emergency Department (HOSPITAL_COMMUNITY): Payer: Self-pay

## 2018-01-23 DIAGNOSIS — L729 Follicular cyst of the skin and subcutaneous tissue, unspecified: Secondary | ICD-10-CM

## 2018-01-23 DIAGNOSIS — F17210 Nicotine dependence, cigarettes, uncomplicated: Secondary | ICD-10-CM | POA: Insufficient documentation

## 2018-01-23 DIAGNOSIS — L720 Epidermal cyst: Secondary | ICD-10-CM | POA: Insufficient documentation

## 2018-01-23 DIAGNOSIS — K429 Umbilical hernia without obstruction or gangrene: Secondary | ICD-10-CM | POA: Insufficient documentation

## 2018-01-23 DIAGNOSIS — Z79899 Other long term (current) drug therapy: Secondary | ICD-10-CM | POA: Insufficient documentation

## 2018-01-23 LAB — COMPREHENSIVE METABOLIC PANEL
ALT: 17 U/L (ref 0–44)
AST: 18 U/L (ref 15–41)
Albumin: 4 g/dL (ref 3.5–5.0)
Alkaline Phosphatase: 77 U/L (ref 38–126)
Anion gap: 6 (ref 5–15)
BUN: 9 mg/dL (ref 6–20)
CO2: 29 mmol/L (ref 22–32)
Calcium: 9.7 mg/dL (ref 8.9–10.3)
Chloride: 105 mmol/L (ref 98–111)
Creatinine, Ser: 0.52 mg/dL (ref 0.44–1.00)
GFR calc Af Amer: >60 mL/min (ref >60)
GFR calc non Af Amer: >60 mL/min (ref >60)
Glucose, Bld: 124 mg/dL — ABNORMAL HIGH (ref 70–99)
Potassium: 4 mmol/L (ref 3.5–5.1)
Sodium: 140 mmol/L (ref 135–145)
Total Bilirubin: 0.5 mg/dL (ref 0.3–1.2)
Total Protein: 7.3 g/dL (ref 6.5–8.1)

## 2018-01-23 LAB — CBC WITH DIFFERENTIAL/PLATELET
Abs Immature Granulocytes: 0.02 10*3/uL (ref 0.00–0.07)
Basophils Absolute: 0 10*3/uL (ref 0.0–0.1)
Basophils Relative: 0 %
Eosinophils Absolute: 0.3 10*3/uL (ref 0.0–0.5)
Eosinophils Relative: 4 %
HCT: 42.2 % (ref 36.0–46.0)
Hemoglobin: 13.3 g/dL (ref 12.0–15.0)
Immature Granulocytes: 0 %
Lymphocytes Relative: 37 %
Lymphs Abs: 2.5 10*3/uL (ref 0.7–4.0)
MCH: 29.2 pg (ref 26.0–34.0)
MCHC: 31.5 g/dL (ref 30.0–36.0)
MCV: 92.5 fL (ref 80.0–100.0)
Monocytes Absolute: 0.5 10*3/uL (ref 0.1–1.0)
Monocytes Relative: 7 %
Neutro Abs: 3.6 10*3/uL (ref 1.7–7.7)
Neutrophils Relative %: 52 %
Platelets: 201 10*3/uL (ref 150–400)
RBC: 4.56 MIL/uL (ref 3.87–5.11)
RDW: 13.2 % (ref 11.5–15.5)
WBC: 6.9 10*3/uL (ref 4.0–10.5)
nRBC: 0 % (ref 0.0–0.2)

## 2018-01-23 LAB — URINALYSIS, ROUTINE W REFLEX MICROSCOPIC
Bilirubin Urine: NEGATIVE
Glucose, UA: NEGATIVE mg/dL
Hgb urine dipstick: NEGATIVE
Ketones, ur: NEGATIVE mg/dL
Leukocytes, UA: NEGATIVE
Nitrite: NEGATIVE
Protein, ur: NEGATIVE mg/dL
Specific Gravity, Urine: 1.015 (ref 1.005–1.030)
pH: 6 (ref 5.0–8.0)

## 2018-01-23 LAB — PREGNANCY, URINE: Preg Test, Ur: NEGATIVE

## 2018-01-23 MED ORDER — IOPAMIDOL (ISOVUE-300) INJECTION 61%
100.0000 mL | Freq: Once | INTRAVENOUS | Status: AC | PRN
  Administered 2018-01-23: 100 mL via INTRAVENOUS

## 2018-01-23 MED ORDER — IOPAMIDOL (ISOVUE-M 300) INJECTION 61%: 15.0000 mL | Freq: Once | INTRAMUSCULAR | Status: DC | PRN

## 2018-01-23 MED ORDER — SODIUM CHLORIDE (PF) 0.9 % IJ SOLN
INTRAMUSCULAR | Status: AC
  Filled 2018-01-23: qty 50

## 2018-01-23 MED ORDER — IOPAMIDOL (ISOVUE-300) INJECTION 61%
INTRAVENOUS | Status: AC
  Filled 2018-01-23: qty 100

## 2018-01-23 NOTE — Discharge Instructions (Signed)
Return here as needed. Follow up with the [facility].

## 2018-01-23 NOTE — ED Notes (Signed)
ED Provider at bedside. 

## 2018-01-23 NOTE — ED Triage Notes (Signed)
Per pt,states she has "bumps in her stomach"-states she noticed 1 month ago-states no pain, no V/N/D

## 2018-01-23 NOTE — ED Notes (Signed)
Pt ambulated to restroom. 

## 2018-01-27 NOTE — ED Provider Notes (Signed)
[facility] DEPT Provider Note   CSN: [id number] Arrival date & time: 01/23/18  1009     History   Chief Complaint Chief Complaint  Patient presents with  . abdominal masses    HPI Samantha Rice is a 52 y.o. female.  HPI Patient presents to the emergency department with swelling noted around the umbilicus.  Patient noted this about a month ago.  She states that they do not get worse and they are not very painful.  Patient states she has had no other symptoms.  The patient denies chest pain, shortness of breath, headache,blurred vision, neck pain, fever, cough, weakness, numbness, dizziness, anorexia, edema, abdominal pain, nausea, vomiting, diarrhea, rash, back pain, dysuria, hematemesis, bloody stool, near syncope, or syncope. Past Medical History:  Diagnosis Date  . Hypertension     Patient Active Problem List   Diagnosis Date Noted  . Preventative health care 10/17/2010    Past Surgical History:  Procedure Laterality Date  . c cection       OB History   None      Home Medications    Prior to Admission medications   Medication Sig Start Date End Date Taking? Authorizing Provider  amLODipine (NORVASC) 5 MG tablet Take 5 mg by mouth daily. 07/25/16  Yes [provider]  fluticasone (FLONASE) 50 MCG/ACT nasal spray Place 2 sprays into both nostrils daily.   Yes [provider]  ibuprofen (ADVIL,MOTRIN) 800 MG tablet Take 1 tablet (800 mg total) by mouth 3 (three) times daily. 11/29/17  Yes Law, Bea Graff, PA-C  Multiple Vitamin (MULTIVITAMIN WITH MINERALS) TABS tablet Take 1 tablet by mouth daily.   Yes [provider]  clindamycin (CLEOCIN) 150 MG capsule Take 3 capsules (450 mg total) by mouth 3 (three) times daily. Patient not taking: Reported on 01/23/2018 11/29/17   Frederica Kuster, PA-C  HYDROcodone-acetaminophen (HYCET) 7.5-325 mg/15 ml solution Take 15 mLs by mouth every 8 (eight) hours as needed for  moderate pain. Patient not taking: Reported on 01/23/2018 07/13/15   Nona Dell, PA-C  Saccharomyces boulardii (PROBIOTIC) 250 MG CAPS Take 1 capsule by mouth daily. Patient not taking: Reported on 01/23/2018 11/29/17   Frederica Kuster, PA-C    Family History No family history on file.  Social History Social History   Tobacco Use  . Smoking status: Current Every Day Smoker    Packs/day: 0.50    Types: Cigarettes  Substance Use Topics  . Alcohol use: No  . Drug use: No     Allergies   Naproxen   Review of Systems Review of Systems  All other systems negative except as documented in the HPI. All pertinent positives and negatives as reviewed in the HPI. Physical Exam Updated Vital Signs BP (!) 173/92   Pulse 76   Temp 98.3 F (36.8 C) (Oral)   Resp 16   LMP 03/14/2014   SpO2 100%   Physical Exam  Constitutional: She is oriented to person, place, and time. She appears well-developed and well-nourished. No distress.  HENT:  Head: Normocephalic and atraumatic.  Mouth/Throat: Oropharynx is clear and moist.  Eyes: Pupils are equal, round, and reactive to light.  Neck: Normal range of motion. Neck supple.  Cardiovascular: Normal rate, regular rhythm and normal heart sounds. Exam reveals no gallop and no friction rub.  No murmur heard. Pulmonary/Chest: Effort normal and breath sounds normal. No respiratory distress. She has no wheezes.  Abdominal: Soft. Bowel sounds are normal. She  exhibits no distension and no mass. There is no tenderness. There is no guarding.    Neurological: She is alert and oriented to person, place, and time. She exhibits normal muscle tone. Coordination normal.  Skin: Skin is warm and dry. Capillary refill takes less than 2 seconds. No rash noted. No erythema.  Psychiatric: She has a normal mood and affect. Her behavior is normal.  Nursing note and vitals reviewed.    ED Treatments / Results  Labs (all labs ordered are listed,  but only abnormal results are displayed) Labs Reviewed  COMPREHENSIVE METABOLIC PANEL - Abnormal; Notable for the following components:      Result Value   Glucose, Bld 124 (*)    All other components within normal limits  CBC WITH DIFFERENTIAL/PLATELET  URINALYSIS, ROUTINE W REFLEX MICROSCOPIC  PREGNANCY, URINE    EKG None  Radiology No results found.  Procedures Procedures (including critical care time)  Medications Ordered in ED Medications  iopamidol (ISOVUE-300) 61 % injection 100 mL (100 mLs Intravenous Contrast Given 01/23/18 1322)     Initial Impression / Assessment and Plan / ED Course  I have reviewed the triage vital signs and the nursing notes.  Pertinent labs & imaging results that were available during my care of the patient were reviewed by me and considered in my medical decision making (see chart for details).     Feel the patient has a small umbilical hernia.  She also has epidermoid cysts around the umbilicus.  I did give her follow-up with surgery.  Patient agrees this plan and all questions were answered. Final Clinical Impressions(s) / ED Diagnoses   Final diagnoses:  Cyst of skin  Epidermoid cyst  Umbilical hernia without obstruction and without gangrene    ED Discharge Orders    None       Dalia Heading, PA-C 01/27/18 1624    Milton Ferguson, MD 01/30/18 [phone]

## 2018-02-14 ENCOUNTER — Ambulatory Visit: Payer: Self-pay | Attending: Family Medicine | Admitting: Family Medicine

## 2018-02-14 ENCOUNTER — Encounter: Payer: Self-pay | Admitting: Family Medicine

## 2018-02-14 VITALS — BP 153/88 | HR 95 | Temp 98.2°F | Resp 18 | Ht 61.5 in | Wt 212.0 lb

## 2018-02-14 DIAGNOSIS — Z886 Allergy status to analgesic agent status: Secondary | ICD-10-CM | POA: Insufficient documentation

## 2018-02-14 DIAGNOSIS — D3502 Benign neoplasm of left adrenal gland: Secondary | ICD-10-CM

## 2018-02-14 DIAGNOSIS — R194 Change in bowel habit: Secondary | ICD-10-CM

## 2018-02-14 DIAGNOSIS — I10 Essential (primary) hypertension: Secondary | ICD-10-CM

## 2018-02-14 DIAGNOSIS — R7303 Prediabetes: Secondary | ICD-10-CM

## 2018-02-14 DIAGNOSIS — R05 Cough: Secondary | ICD-10-CM

## 2018-02-14 DIAGNOSIS — R222 Localized swelling, mass and lump, trunk: Secondary | ICD-10-CM

## 2018-02-14 DIAGNOSIS — E669 Obesity, unspecified: Secondary | ICD-10-CM

## 2018-02-14 DIAGNOSIS — F17210 Nicotine dependence, cigarettes, uncomplicated: Secondary | ICD-10-CM | POA: Insufficient documentation

## 2018-02-14 DIAGNOSIS — R739 Hyperglycemia, unspecified: Secondary | ICD-10-CM

## 2018-02-14 DIAGNOSIS — Z6839 Body mass index (BMI) 39.0-39.9, adult: Secondary | ICD-10-CM

## 2018-02-14 DIAGNOSIS — R229 Localized swelling, mass and lump, unspecified: Secondary | ICD-10-CM

## 2018-02-14 DIAGNOSIS — J069 Acute upper respiratory infection, unspecified: Secondary | ICD-10-CM

## 2018-02-14 DIAGNOSIS — Z833 Family history of diabetes mellitus: Secondary | ICD-10-CM | POA: Insufficient documentation

## 2018-02-14 LAB — POCT GLYCOSYLATED HEMOGLOBIN (HGB A1C): Hemoglobin A1C: 6.3 % — AB (ref 4.0–5.6)

## 2018-02-14 MED ORDER — METFORMIN HCL 500 MG PO TABS: 500.0000 mg | ORAL_TABLET | Freq: Two times a day (BID) | ORAL | 3 refills | Status: DC

## 2018-02-14 MED ORDER — AMLODIPINE BESYLATE 5 MG PO TABS: 5.0000 mg | ORAL_TABLET | Freq: Every day | ORAL | 6 refills | Status: DC

## 2018-02-14 NOTE — Patient Instructions (Signed)
Prediabetes Eating Plan Prediabetes is a condition that causes blood sugar (glucose) levels to be higher than normal. This increases the risk for developing diabetes. In order to prevent diabetes from developing, your health care provider may recommend a diet and other lifestyle changes to help you:  Control your blood glucose levels.  Improve your cholesterol levels.  Manage your blood pressure. Your health care provider may recommend working with a diet and nutrition specialist (dietitian) to make a meal plan that is best for you. What are tips for following this plan? Lifestyle  Set weight loss goals with the help of your health care team. It is recommended that most people with prediabetes lose 7% of their current body weight.  Exercise for at least 30 minutes at least 5 days a week.  Attend a support group or seek ongoing support from a mental health counselor.  Take over-the-counter and prescription medicines only as told by your health care provider. Reading food labels  Read food labels to check the amount of fat, salt (sodium), and sugar in prepackaged foods. Avoid foods that have: ? Saturated fats. ? Trans fats. ? Added sugars.  Avoid foods that have more than 300 milligrams (mg) of sodium per serving. Limit your daily sodium intake to less than 2,300 mg each day. Shopping  Avoid buying pre-made and processed foods. Cooking  Cook with olive oil. Do not use butter, lard, or ghee.  Bake, broil, grill, or boil foods. Avoid frying. Meal planning   Work with your dietitian to develop an eating plan that is right for you. This may include: ? Tracking how many calories you take in. Use a food diary, notebook, or mobile application to track what you eat at each meal. ? Using the glycemic index (GI) to plan your meals. The index tells you how quickly a food will raise your blood glucose. Choose low-GI foods. These foods take a longer time to raise blood glucose.  Consider  following a Mediterranean diet. This diet includes: ? Several servings each day of fresh fruits and vegetables. ? Eating fish at least twice a week. ? Several servings each day of whole grains, beans, nuts, and seeds. ? Using olive oil instead of other fats. ? Moderate alcohol consumption. ? Eating small amounts of red meat and whole-fat dairy.  If you have high blood pressure, you may need to limit your sodium intake or follow a diet such as the DASH eating plan. DASH is an eating plan that aims to lower high blood pressure. What foods are recommended? The items listed below may not be a complete list. Talk with your dietitian about what dietary choices are best for you. Grains Whole grains, such as whole-wheat or whole-grain breads, crackers, cereals, and pasta. Unsweetened oatmeal. Bulgur. Barley. Quinoa. Brown rice. Corn or whole-wheat flour tortillas or taco shells. Vegetables Lettuce. Spinach. Peas. Beets. Cauliflower. Cabbage. Broccoli. Carrots. Tomatoes. Squash. Eggplant. Herbs. Peppers. Onions. Cucumbers. Brussels sprouts. Fruits Berries. Bananas. Apples. Oranges. Grapes. Papaya. Mango. Pomegranate. Kiwi. Grapefruit. Cherries. Meats and other protein foods Seafood. Poultry without skin. Lean cuts of pork and beef. Tofu. Eggs. Nuts. Beans. Dairy Low-fat or fat-free dairy products, such as yogurt, cottage cheese, and cheese. Beverages Water. Tea. Coffee. Sugar-free or diet soda. Seltzer water. Lowfat or no-fat milk. Milk alternatives, such as soy or almond milk. Fats and oils Olive oil. Canola oil. Sunflower oil. Grapeseed oil. Avocado. Walnuts. Sweets and desserts Sugar-free or low-fat pudding. Sugar-free or low-fat ice cream and other frozen treats.   Tea. Coffee. Sugar-free or diet soda. Seltzer water. Lowfat or no-fat milk. Milk alternatives, such as soy or almond milk.  Fats and oils  Olive oil. Canola oil. Sunflower oil. Grapeseed oil. Avocado. Walnuts.  Sweets and desserts  Sugar-free or low-fat pudding. Sugar-free or low-fat ice cream and other frozen treats.  Seasoning and other foods  Herbs. Sodium-free spices. Mustard. Relish. Low-fat, low-sugar ketchup. Low-fat, low-sugar barbecue sauce. Low-fat or fat-free mayonnaise.  What foods are not recommended?  The items  listed below may not be a complete list. Talk with your dietitian about what dietary choices are best for you.  Grains  Refined white flour and flour products, such as bread, pasta, snack foods, and cereals.  Vegetables  Canned vegetables. Frozen vegetables with butter or cream sauce.  Fruits  Fruits canned with syrup.  Meats and other protein foods  Fatty cuts of meat. Poultry with skin. Breaded or fried meat. Processed meats.  Dairy  Full-fat yogurt, cheese, or milk.  Beverages  Sweetened drinks, such as sweet iced tea and soda.  Fats and oils  Butter. Lard. Ghee.  Sweets and desserts  Baked goods, such as cake, cupcakes, pastries, cookies, and cheesecake.  Seasoning and other foods  Spice mixes with added salt. Ketchup. Barbecue sauce. Mayonnaise.  Summary   To prevent diabetes from developing, you may need to make diet and other lifestyle changes to help control blood sugar, improve cholesterol levels, and manage your blood pressure.   Set weight loss goals with the help of your health care team. It is recommended that most people with prediabetes lose 7 percent of their current body weight.   Consider following a Mediterranean diet that includes plenty of fresh fruits and vegetables, whole grains, beans, nuts, seeds, fish, lean meat, low-fat dairy, and healthy oils.  This information is not intended to replace advice given to you by your health care provider. Make sure you discuss any questions you have with your health care provider.  Document Released: 06/30/2014 Document Revised: 04/19/2016 Document Reviewed: 04/19/2016  Elsevier Interactive Patient Education  2019 [employer]    Preventing Type 2 Diabetes Mellitus  Type 2 diabetes (type 2 diabetes mellitus) is a long-term (chronic) disease that affects blood sugar (glucose) levels. Normally, a hormone called insulin allows glucose to enter cells in the body. The cells use glucose for energy. In type 2 diabetes, one or both of these problems may be  present:   The body does not make enough insulin.   The body does not respond properly to insulin that it makes (insulin resistance).  Insulin resistance or lack of insulin causes excess glucose to build up in the blood instead of going into cells. As a result, high blood glucose (hyperglycemia) develops, which can cause many complications. Being overweight or obese and having an inactive (sedentary) lifestyle can increase your risk for diabetes. Type 2 diabetes can be delayed or prevented by making certain nutrition and lifestyle changes.  What nutrition changes can be made?     Eat healthy meals and snacks regularly. Keep a healthy snack with you for when you get hungry between meals, such as fruit or a handful of nuts.   Eat lean meats and proteins that are low in saturated fats, such as chicken, fish, egg whites, and beans. Avoid processed meats.   Eat plenty of fruits and vegetables and plenty of grains that have not been processed (whole grains). It is recommended that you eat:  ? 1?2 cups of fruit   every day.  ? 2?3 cups of vegetables every day.  ? 6?8 oz of whole grains every day, such as oats, whole wheat, bulgur, brown rice, quinoa, and millet.   Eat low-fat dairy products, such as milk, yogurt, and cheese.   Eat foods that contain healthy fats, such as nuts, avocado, olive oil, and canola oil.   Drink water throughout the day. Avoid drinks that contain added sugar, such as soda or sweet tea.   Follow instructions from your health care provider about specific eating or drinking restrictions.   Control how much food you eat at a time (portion size).  ? Check food labels to find out the serving sizes of foods.  ? Use a kitchen scale to weigh amounts of foods.   Saute or steam food instead of frying it. Cook with water or broth instead of oils or butter.   Limit your intake of:  ? Salt (sodium). Have no more than 1 tsp (2,400 mg) of sodium a day. If you have heart disease or high blood pressure,  have less than ? tsp (1,500 mg) of sodium a day.  ? Saturated fat. This is fat that is solid at room temperature, such as butter or fat on meat.  What lifestyle changes can be made?  Activity     Do moderate-intensity physical activity for at least 30 minutes on at least 5 days of the week, or as much as told by your health care provider.   Ask your health care provider what activities are safe for you. A mix of physical activities may be best, such as walking, swimming, cycling, and strength training.   Try to add physical activity into your day. For example:  ? Park in spots that are farther away than usual, so that you walk more. For example, park in a far corner of the parking lot when you go to the office or the grocery store.  ? Take a walk during your lunch break.  ? Use stairs instead of elevators or escalators.  Weight Loss   Lose weight as directed. Your health care provider can determine how much weight loss is best for you and can help you lose weight safely.   If you are overweight or obese, you may be instructed to lose at least 5?7 % of your body weight.  Alcohol and Tobacco     Limit alcohol intake to no more than 1 drink a day for nonpregnant women and 2 drinks a day for men. One drink equals 12 oz of beer, 5 oz of wine, or 1 oz of hard liquor.   Do not use any tobacco products, such as cigarettes, chewing tobacco, and e-cigarettes. If you need help quitting, ask your health care provider.  Work With Your Health Care Provider   Have your blood glucose tested regularly, as told by your health care provider.   Discuss your risk factors and how you can reduce your risk for diabetes.   Get screening tests as told by your health care provider. You may have screening tests regularly, especially if you have certain risk factors for type 2 diabetes.   Make an appointment with a diet and nutrition specialist (registered dietitian). A registered dietitian can help you make a healthy eating plan  and can help you understand portion sizes and food labels.  Why are these changes important?   It is possible to prevent or delay type 2 diabetes and related health problems by making lifestyle and nutrition   changes.   It can be difficult to recognize signs of type 2 diabetes. The best way to avoid possible damage to your body is to take actions to prevent the disease before you develop symptoms.  What can happen if changes are not made?   Your blood glucose levels may keep increasing. Having high blood glucose for a long time is dangerous. Too much glucose in your blood can damage your blood vessels, heart, kidneys, nerves, and eyes.   You may develop prediabetes or type 2 diabetes. Type 2 diabetes can lead to many chronic health problems and complications, such as:  ? Heart disease.  ? Stroke.  ? Blindness.  ? Kidney disease.  ? Depression.  ? Poor circulation in the feet and legs, which could lead to surgical removal (amputation) in severe cases.  Where to find support   Ask your health care provider to recommend a registered dietitian, diabetes educator, or weight loss program.   Look for local or online weight loss groups.   Join a gym, fitness club, or outdoor activity group, such as a walking club.  Where to find more information  To learn more about diabetes and diabetes prevention, visit:   American Diabetes Association (ADA): www.diabetes.org   National Institute of Diabetes and Digestive and Kidney Diseases: www.niddk.nih.gov/health-information/diabetes  To learn more about healthy eating, visit:   The U.S. Department of Agriculture (USDA), Choose My Plate: www.choosemyplate.gov/food-groups   Office of Disease Prevention and Health Promotion (ODPHP), Dietary Guidelines: www.health.gov/dietaryguidelines  Summary   You can reduce your risk for type 2 diabetes by increasing your physical activity, eating healthy foods, and losing weight as directed.   Talk with your health care provider about  your risk for type 2 diabetes. Ask about any blood tests or screening tests that you need to have.  This information is not intended to replace advice given to you by your health care provider. Make sure you discuss any questions you have with your health care provider.  Document Released: 06/07/2015 Document Revised: 01/25/2017 Document Reviewed: 04/06/2015  Elsevier Interactive Patient Education  2019 [employer]

## 2018-02-14 NOTE — Progress Notes (Signed)
Subjective:    Patient ID: Samantha Rice, female    DOB: [birth year], 53 y.o.   MRN: [id number]  HPI       52 yo female new to the practice.  Patient is status post emergency department visit on 01/23/2018 where per ED note she complained of abdominal swelling and sensation of feeling masses/nodules beneath the skin of her abdomen.  ED provider thought the patient might have a small umbilical hernia as well as epidermoid cyst around the umbilicus and patient was referred to surgery.  At today's visit, patient also feels that these masses are still present.  Patient has not followed up with surgery as she states that she was told that she would need a primary care provider to make the referral.  Patient states that she went to the emergency department because she felt that her abdomen had become more firm above her bellybutton.  Patient states that she did not have abdominal pain.  Patient states that about 2 months before she went to the emergency department she was having episodes of loose stools which was unusual.  Patient also feels as if she is lost about 8 pounds in the last 2 months without trying.  Patient denies any current abdominal pain, no urinary frequency, urgency or dysuria.  Patient denies any increased thirst and no blurred vision.  Patient has had recent onset of nasal congestion with postnasal drainage and occasional nonproductive cough.       Patient reports that her only past medical history has been hypertension.  Patient reports an allergy to Naprosyn/naproxen which causes her to have facial swelling.  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  Patient is married.  Patient smokes about 10 to 15 cigarettes a day she has used a patch in the past to stop smoking but believes she restarted secondary to stress.  Patient reports family history significant for paternal grandmother and father with diabetes patient states that  her mom has dementia and patient has a brother with diabetes and a sister with Crohn's disease.  Past Medical History:  Diagnosis Date  . Hypertension    Past Surgical History:  Procedure Laterality Date  . c cection     Family History  Problem Relation Age of Onset  . Dementia Mother   . Diabetes Father   . Crohn's disease Sister   . Diabetes Brother   . Diabetes Paternal Grandmother    Social History   Tobacco Use  . Smoking status: Current Every Day Smoker    Packs/day: 0.50    Types: Cigarettes  . Smokeless tobacco: Never Used  Substance Use Topics  . Alcohol use: No  . Drug use: No   Allergies  Allergen Reactions  . Naproxen     Per patient caused facial swelling.      Review of Systems  Constitutional: Positive for fatigue and unexpected weight change. Negative for chills and fever.  HENT: Positive for congestion, postnasal drip and rhinorrhea. Negative for sore throat and trouble swallowing.   Eyes: Negative for photophobia and visual disturbance.  Respiratory: Positive for cough. Negative for shortness of breath.   Cardiovascular: Negative for chest pain, palpitations and leg swelling.  Gastrointestinal: Positive for abdominal distention. Negative for abdominal pain, blood in stool, constipation, diarrhea and nausea.  Endocrine: Negative for polydipsia, polyphagia and polyuria.  Genitourinary: Negative for dysuria and frequency.  Musculoskeletal: Negative for arthralgias, back pain, gait problem  and joint swelling.  Neurological: Negative for dizziness and headaches.  Hematological: Negative for adenopathy. Does not bruise/bleed easily.       Objective:   Physical Exam BP (!) 153/88 (BP Location: Left Arm, Patient Position: Sitting, Cuff Size: Normal)   Pulse 95   Temp 98.2 F (36.8 C) (Oral)   Resp 18   Ht 5' 1.5" (1.562 m)   Wt 212 lb (96.2 kg)   LMP 03/14/2014   SpO2 97%   BMI 39.41 kg/m   Nurse's notes and vital signs reviewed General-  overweight short statured female in no acute distress ENT-TMs gray, nares with mild edema of the nasal turbinates with clear discharge, patient with mild posterior pharynx erythema and a slightly narrowed posterior airway secondary to body habitus Neck-supple, no lymphadenopathy, no thyromegaly, no carotid bruit Lungs-clear to auscultation bilaterally Cardiovascular-regular rate and rhythm Abdomen-patient with some abdominal distention/truncal obesity and patient does have some subcutaneous nodules in the left mid upper abdomen and near the umbilicus Back-no CVA tenderness Extremities-no edema      Assessment & Plan:  1. Essential hypertension Patient will continue the use of amlodipine 5 mg which she currently takes for hypertension.  Blood pressure is reasonably controlled on this medication. - amLODipine (NORVASC) 5 MG tablet; Take 1 tablet (5 mg total) by mouth daily.  Dispense: 30 tablet; Refill: 6  2. Elevated blood sugar level During patient's emergency department visit, patient with blood sugar of 124 and patient with a strong family history of diabetes.  Hemoglobin A1c was done at today's visit.  Patient's hemoglobin A1c was elevated at 6.3 consistent with prediabetes and being very close to being diagnostic of patient being diabetic. - HgB A1c  3. Change in bowel habits Patient reports a recent change in bowel habits and patient has not had baseline colonoscopy.  Patient will be referred to gastroenterology for further evaluation and treatment - Ambulatory referral to Gastroenterology  4. Subcutaneous nodules, generalized Patient with some subcutaneous abdominal nodules.  Patient will be referred to gastroenterology as she has also had some recent changes in bowel habits.  Patient will call/return if she is interested in referral as well to surgery regarding the subcutaneous nodules - Ambulatory referral to Gastroenterology  5. URI with cough and congestion Patient with URI  symptoms with cough and congestion.  Patient may take over-the-counter antihistamines or Coricidin HP but should avoid the use of decongestants which can raise the blood pressure  6. Adenoma of left adrenal gland Patient had CT scan done on 01/23/2018 which showed a 2.1 cm left adrenal adenoma.  Adenoma was stable from prior imaging.  CT scan did not show abdominal wall hernia or abnormality.  CT did show a few tiny subcentimeter nodules within the subcutaneous fat of the mid anterior abdominal wall near the dermis which could represent dermal cyst, fat necrosis or injection sites.  Patient does not use any injectable medication at this time.  Patient also with aortic atherosclerosis.  7. Prediabetes Patient had elevated blood sugar during her recent ED visit and patient with family history remarkable for diabetes in her father, paternal grandmother and brother.  Patient's hemoglobin A1c at today's visit was very elevated at 6.3 placing patient in the category of prediabetes and very close to value of 6.5 which is diagnostic of diabetes.  Discussed low carbohydrate diet, need for weight loss and prescription provided for metformin 500 mg which she will start taking 1 pill daily after the evening meal and then increase  in approximately 1 week to twice per day if she tolerates initial evening dose.  Patient should return to clinic should she develop increased thirst, urinary frequency, blurred vision or any concerns or if she has issues tolerating the metformin otherwise hemoglobin A1c will be repeated in approximately 3 months - metFORMIN (GLUCOPHAGE) 500 MG tablet; Take 1 tablet (500 mg total) by mouth 2 (two) times daily with a meal.  Dispense: 180 tablet; Refill: 3  8. Obesity (BMI 30-39.9) Patient with obesity and hemoglobin A1c at today's visit consistent with prediabetes.  Regular exercise as well as low carbohydrate/low fat diet encouraged.

## 2018-03-03 ENCOUNTER — Encounter: Payer: Self-pay | Admitting: Family Medicine

## 2018-03-13 ENCOUNTER — Encounter: Payer: Self-pay | Admitting: Gastroenterology

## 2018-03-18 ENCOUNTER — Ambulatory Visit: Payer: Medicaid Other | Admitting: Family Medicine

## 2018-03-28 ENCOUNTER — Ambulatory Visit: Payer: Medicaid Other | Admitting: Gastroenterology

## 2018-04-03 ENCOUNTER — Ambulatory Visit: Payer: Medicaid Other | Admitting: Family Medicine

## 2018-04-23 ENCOUNTER — Ambulatory Visit: Payer: Medicaid Other | Admitting: Family Medicine

## 2018-07-04 DIAGNOSIS — J302 Other seasonal allergic rhinitis: Secondary | ICD-10-CM | POA: Insufficient documentation

## 2019-03-11 ENCOUNTER — Encounter (HOSPITAL_COMMUNITY): Payer: Self-pay | Admitting: Emergency Medicine

## 2019-03-11 ENCOUNTER — Other Ambulatory Visit: Payer: Self-pay

## 2019-03-11 DIAGNOSIS — R102 Pelvic and perineal pain: Secondary | ICD-10-CM | POA: Diagnosis present

## 2019-03-11 DIAGNOSIS — Z79899 Other long term (current) drug therapy: Secondary | ICD-10-CM | POA: Diagnosis not present

## 2019-03-11 DIAGNOSIS — N390 Urinary tract infection, site not specified: Secondary | ICD-10-CM | POA: Insufficient documentation

## 2019-03-11 DIAGNOSIS — I10 Essential (primary) hypertension: Secondary | ICD-10-CM | POA: Insufficient documentation

## 2019-03-11 DIAGNOSIS — F17210 Nicotine dependence, cigarettes, uncomplicated: Secondary | ICD-10-CM | POA: Diagnosis not present

## 2019-03-11 LAB — URINALYSIS, ROUTINE W REFLEX MICROSCOPIC
Bacteria, UA: NONE SEEN
Bilirubin Urine: NEGATIVE
Glucose, UA: NEGATIVE mg/dL
Ketones, ur: NEGATIVE mg/dL
Nitrite: NEGATIVE
Protein, ur: 30 mg/dL — AB
Specific Gravity, Urine: 1.005 (ref 1.005–1.030)
pH: 6 (ref 5.0–8.0)

## 2019-03-11 LAB — I-STAT BETA HCG BLOOD, ED (MC, WL, AP ONLY): I-stat hCG, quantitative: <5 m[IU]/mL (ref <5)

## 2019-03-11 LAB — CBC
HCT: 42.3 % (ref 36.0–46.0)
Hemoglobin: 13.7 g/dL (ref 12.0–15.0)
MCH: 30.2 pg (ref 26.0–34.0)
MCHC: 32.4 g/dL (ref 30.0–36.0)
MCV: 93.4 fL (ref 80.0–100.0)
Platelets: 213 10*3/uL (ref 150–400)
RBC: 4.53 MIL/uL (ref 3.87–5.11)
RDW: 13.6 % (ref 11.5–15.5)
WBC: 9.1 10*3/uL (ref 4.0–10.5)
nRBC: 0 % (ref 0.0–0.2)

## 2019-03-11 NOTE — ED Triage Notes (Signed)
Pt states that she has been having rt flank pain with accompanying pelvic discomfort x 2 days.  Relates sensation of needing to urinate without being able to.  Difficult to fully assess patient due to her inability to remove herself from her phone conversation.

## 2019-03-12 ENCOUNTER — Emergency Department (HOSPITAL_COMMUNITY)
Admission: EM | Admit: 2019-03-12 | Discharge: 2019-03-12 | Disposition: A | Discharge status: Home / Self Care | Payer: BC Managed Care – PPO | Attending: Emergency Medicine | Admitting: Emergency Medicine

## 2019-03-12 DIAGNOSIS — N390 Urinary tract infection, site not specified: Secondary | ICD-10-CM

## 2019-03-12 LAB — COMPREHENSIVE METABOLIC PANEL
ALT: 15 U/L (ref 0–44)
AST: 16 U/L (ref 15–41)
Albumin: 4.2 g/dL (ref 3.5–5.0)
Alkaline Phosphatase: 71 U/L (ref 38–126)
Anion gap: 7 (ref 5–15)
BUN: 10 mg/dL (ref 6–20)
CO2: 28 mmol/L (ref 22–32)
Calcium: 9.8 mg/dL (ref 8.9–10.3)
Chloride: 105 mmol/L (ref 98–111)
Creatinine, Ser: 0.55 mg/dL (ref 0.44–1.00)
GFR calc Af Amer: >60 mL/min (ref >60)
GFR calc non Af Amer: >60 mL/min (ref >60)
Glucose, Bld: 114 mg/dL — ABNORMAL HIGH (ref 70–99)
Potassium: 4.3 mmol/L (ref 3.5–5.1)
Sodium: 140 mmol/L (ref 135–145)
Total Bilirubin: 0.8 mg/dL (ref 0.3–1.2)
Total Protein: 7.8 g/dL (ref 6.5–8.1)

## 2019-03-12 LAB — LIPASE, BLOOD: Lipase: 38 U/L (ref 11–51)

## 2019-03-12 MED ORDER — CEPHALEXIN 500 MG PO CAPS: 500.0000 mg | ORAL_CAPSULE | Freq: Four times a day (QID) | ORAL | 0 refills | Status: DC

## 2019-03-12 NOTE — ED Provider Notes (Signed)
[facility] DEPT Provider Note   CSN: [id number] Arrival date & time: 03/11/19  2242     History Chief Complaint  Patient presents with  . Flank Pain  . Dysuria    Samantha Rice is a 54 y.o. female.  HPI She presents for evaluation of right-sided flank pain and suprapubic discomfort.  She has urinary urgency, without hematuria.  She has mild dysuria.  She has been uncomfortable for couple of days.  She is not having fever, chills, cough, chest pain, weakness or dizziness.  No recent UTI.  There are no other known modifying factors.    Past Medical History:  Diagnosis Date  . Hypertension     Patient Active Problem List   Diagnosis Date Noted  . Preventative health care 10/17/2010    Past Surgical History:  Procedure Laterality Date  . c cection       OB History   No obstetric history on file.     Family History  Problem Relation Age of Onset  . Dementia Mother   . Diabetes Father   . Crohn's disease Sister   . Diabetes Brother   . Diabetes Paternal Grandmother     Social History   Tobacco Use  . Smoking status: Current Every Day Smoker    Packs/day: 0.50    Types: Cigarettes  . Smokeless tobacco: Never Used  Substance Use Topics  . Alcohol use: No  . Drug use: No    Home Medications Prior to Admission medications   Medication Sig Start Date End Date Taking? Authorizing Provider  amLODipine (NORVASC) 5 MG tablet Take 1 tablet (5 mg total) by mouth daily. 02/14/18   Fulp, Cammie, MD  fluticasone (FLONASE) 50 MCG/ACT nasal spray Place 2 sprays into both nostrils daily.    [provider]  ibuprofen (ADVIL,MOTRIN) 800 MG tablet Take 1 tablet (800 mg total) by mouth 3 (three) times daily. 11/29/17   Law, Bea Graff, PA-C  metFORMIN (GLUCOPHAGE) 500 MG tablet Take 1 tablet (500 mg total) by mouth 2 (two) times daily with a meal. 02/14/18   Fulp, Cammie, MD  Multiple Vitamin (MULTIVITAMIN WITH MINERALS) TABS tablet  Take 1 tablet by mouth daily.    [provider]  Saccharomyces boulardii (PROBIOTIC) 250 MG CAPS Take 1 capsule by mouth daily. 11/29/17   Frederica Kuster, PA-C    Allergies    Naproxen  Review of Systems   Review of Systems  Physical Exam Updated Vital Signs BP (!) 149/91 (BP Location: Right Arm)   Pulse 94   Temp 98.4 F (36.9 C) (Axillary)   Resp 18   Ht 5\' 1"  (1.549 m)   Wt 92.1 kg   LMP 03/14/2014   SpO2 100%   BMI 38.36 kg/m   Physical Exam  ED Results / Procedures / Treatments   Labs (all labs ordered are listed, but only abnormal results are displayed) Labs Reviewed  URINALYSIS, ROUTINE W REFLEX MICROSCOPIC - Abnormal; Notable for the following components:      Result Value   Hgb urine dipstick MODERATE (*)    Protein, ur 30 (*)    Leukocytes,Ua MODERATE (*)    All other components within normal limits  COMPREHENSIVE METABOLIC PANEL - Abnormal; Notable for the following components:   Glucose, Bld 114 (*)    All other components within normal limits  LIPASE, BLOOD  CBC  I-STAT BETA HCG BLOOD, ED ([facility], [facility], [facility] ONLY)    EKG None  Radiology  No results found.  Procedures Procedures (including critical care time)  Medications Ordered in ED Medications - No data to display  ED Course  I have reviewed the triage vital signs and the nursing notes.  Pertinent labs & imaging results that were available during my care of the patient were reviewed by me and considered in my medical decision making (see chart for details).  Clinical Course as of Mar 11 24  Wed Mar 12, 2019  0016 Normal  I-Stat beta hCG blood, ED [EW]  0017 Normal except glucose high  Comprehensive metabolic panel(!) [EW]  [id number] Normal  Lipase, blood [EW]  0017 Normal except hemoglobin, protein, leukocyte present, white cells 25-50, elevated  Urinalysis, Routine w reflex microscopic- may I&O cath if menses(!) [EW]  0017 Normal  CBC [EW]    Clinical Course User Index [EW]  Daleen Bo, MD   MDM Rules/Calculators/A&P                       Patient Vitals for the past 24 hrs:  BP Temp Temp src Pulse Resp SpO2 Height Weight  03/11/19 2305 -- -- -- -- -- -- 5\' 1"  (1.549 m) 92.1 kg  03/11/19 2304 (!) 149/91 98.4 F (36.9 C) Axillary 94 18 100 % -- --    12:35 AM Reevaluation with update and discussion. After initial assessment and treatment, an updated evaluation reveals she is comfortable has no further complaints.  Findings discussed and questions answered. Daleen Bo   Medical Decision Making: Evaluation consistent with uncomplicated UTI.  Doubt nephritis, serious bacterial infection or impending vascular collapse.  CRITICAL CARE-no Performed by: Daleen Bo   Nursing Notes Reviewed/ Care Coordinated Applicable Imaging Reviewed Interpretation of Laboratory Data incorporated into ED treatment  The patient appears reasonably screened and/or stabilized for discharge and I doubt any other medical condition or other [facility] requiring further screening, evaluation, or treatment in the ED at this time prior to discharge.  Plan: Home Medications-continue usual medicines and use OTC analgesia of choice; Home Treatments-fluids and rest.; return here if the recommended treatment, does not improve the symptoms; Recommended follow up-PCP checkup 1 week and as needed    Final Clinical Impression(s) / ED Diagnoses Final diagnoses:  None    Rx / DC Orders ED Discharge Orders    None       Daleen Bo, MD 03/12/19 [phone]

## 2019-03-12 NOTE — Discharge Instructions (Addendum)
We sent a prescription for antibiotic to treat a UTI to your pharmacy.  For pain, take Tylenol or Motrin.  Make sure you are drinking plenty of water, 4 to 6 glasses a day.  See your doctor for checkup and return here if needed.

## 2019-04-26 ENCOUNTER — Ambulatory Visit: Payer: BC Managed Care – PPO

## 2019-05-08 ENCOUNTER — Encounter: Payer: Self-pay | Admitting: Gastroenterology

## 2019-05-27 ENCOUNTER — Ambulatory Visit (AMBULATORY_SURGERY_CENTER): Payer: Self-pay

## 2019-05-27 ENCOUNTER — Other Ambulatory Visit: Payer: Self-pay

## 2019-05-27 VITALS — Temp 95.5°F | Ht 61.0 in | Wt 198.2 lb

## 2019-05-27 DIAGNOSIS — Z1211 Encounter for screening for malignant neoplasm of colon: Secondary | ICD-10-CM

## 2019-05-27 DIAGNOSIS — Z01818 Encounter for other preprocedural examination: Secondary | ICD-10-CM

## 2019-05-27 NOTE — Progress Notes (Signed)
No allergies to soy or egg Pt is not on blood thinners or diet pills Denies issues with sedation/intubation Denies atrial flutter/fib Denies constipation   Emmi instructions given to pt  Pt is aware of Covid safety and care partner requirements.  

## 2019-06-05 ENCOUNTER — Other Ambulatory Visit: Payer: Self-pay | Admitting: Gastroenterology

## 2019-06-05 ENCOUNTER — Other Ambulatory Visit: Payer: Self-pay

## 2019-06-05 ENCOUNTER — Ambulatory Visit (INDEPENDENT_AMBULATORY_CARE_PROVIDER_SITE_OTHER): Payer: Self-pay

## 2019-06-05 DIAGNOSIS — Z1159 Encounter for screening for other viral diseases: Secondary | ICD-10-CM

## 2019-06-06 ENCOUNTER — Encounter: Payer: Self-pay | Admitting: Gastroenterology

## 2019-06-06 LAB — SARS CORONAVIRUS 2 (TAT 6-24 HRS): SARS Coronavirus 2: NEGATIVE

## 2019-06-10 ENCOUNTER — Other Ambulatory Visit: Payer: Self-pay

## 2019-06-10 ENCOUNTER — Ambulatory Visit (AMBULATORY_SURGERY_CENTER): Payer: BC Managed Care – PPO | Admitting: Gastroenterology

## 2019-06-10 ENCOUNTER — Encounter: Payer: Self-pay | Admitting: Gastroenterology

## 2019-06-10 VITALS — BP 145/82 | HR 82 | Temp 96.8°F | Resp 14 | Ht 61.0 in | Wt 198.0 lb

## 2019-06-10 DIAGNOSIS — D124 Benign neoplasm of descending colon: Secondary | ICD-10-CM

## 2019-06-10 DIAGNOSIS — Z1211 Encounter for screening for malignant neoplasm of colon: Secondary | ICD-10-CM | POA: Diagnosis not present

## 2019-06-10 MED ORDER — SODIUM CHLORIDE 0.9 % IV SOLN: 500.0000 mL | Freq: Once | INTRAVENOUS | Status: AC

## 2019-06-10 NOTE — Progress Notes (Signed)
Temp-JB VS-CW  Pt's states no medical or surgical changes since previsit or office visit.  

## 2019-06-10 NOTE — Op Note (Signed)
[facility] Patient Name: Samantha Rice Procedure Date: 06/10/2019 8:57 AM MRN: [id number] Endoscopist: Milus Banister , MD Age: 54 Referring MD:  Date of Birth: [birth year] Gender: Female Account #: [id number] Procedure:                Colonoscopy Indications:              Screening for colorectal malignant neoplasm Medicines:                Monitored Anesthesia Care Procedure:                Pre-Anesthesia Assessment:                           - Prior to the procedure, a History and Physical                            was performed, and patient medications and                            allergies were reviewed. The patient's tolerance of                            previous anesthesia was also reviewed. The risks                            and benefits of the procedure and the sedation                            options and risks were discussed with the patient.                            All questions were answered, and informed consent                            was obtained. Prior Anticoagulants: The patient has                            taken no previous anticoagulant or antiplatelet                            agents. ASA Grade Assessment: II - A patient with                            mild systemic disease. After reviewing the risks                            and benefits, the patient was deemed in                            satisfactory condition to undergo the procedure.                           After obtaining informed consent, the colonoscope  was passed under direct vision. Throughout the                            procedure, the patient's blood pressure, pulse, and                            oxygen saturations were monitored continuously. The                            Colonoscope was introduced through the anus and                            advanced to the the cecum, identified by                            appendiceal orifice and  ileocecal valve. The                            colonoscopy was performed without difficulty. The                            patient tolerated the procedure well. The quality                            of the bowel preparation was good. The ileocecal                            valve, appendiceal orifice, and rectum were                            photographed. Scope In: 9:04:58 AM Scope Out: 9:13:53 AM Scope Withdrawal Time: 0 hours 6 minutes 52 seconds  Total Procedure Duration: 0 hours 8 minutes 55 seconds  Findings:                 A 3 mm polyp was found in the descending colon. The                            polyp was sessile. The polyp was removed with a                            cold snare. Resection and retrieval were complete.                           The exam was otherwise without abnormality on                            direct and retroflexion views. Complications:            No immediate complications. Estimated blood loss:                            None. Estimated Blood Loss:     Estimated blood loss: none. Impression:               -  One 3 mm polyp in the descending colon, removed                            with a cold snare. Resected and retrieved.                           - The examination was otherwise normal on direct                            and retroflexion views. Recommendation:           - Patient has a contact number available for                            emergencies. The signs and symptoms of potential                            delayed complications were discussed with the                            patient. Return to normal activities tomorrow.                            Written discharge instructions were provided to the                            patient.                           - Resume previous diet.                           - Continue present medications.                           - Await pathology results. Milus Banister, MD 06/10/2019  9:17:13 AM This report has been signed electronically.

## 2019-06-10 NOTE — Progress Notes (Signed)
Called to room to assist during endoscopic procedure.  Patient ID and intended procedure confirmed with present staff. Received instructions for my participation in the procedure from the performing physician.  

## 2019-06-10 NOTE — Progress Notes (Signed)
Pt Drowsy. VSS. To PACU, report to RN. No anesthetic complications noted.  

## 2019-06-10 NOTE — Patient Instructions (Signed)
Discharge instructions given. Handout on polyps. Resume previous medications. YOU HAD AN ENDOSCOPIC PROCEDURE TODAY AT THE [facility]:   Refer to the procedure report that was given to you for any specific questions about what was found during the examination.  If the procedure report does not answer your questions, please call your gastroenterologist to clarify.  If you requested that your care partner not be given the details of your procedure findings, then the procedure report has been included in a sealed envelope for you to review at your convenience later.  YOU SHOULD EXPECT: Some feelings of bloating in the abdomen. Passage of more gas than usual.  Walking can help get rid of the air that was put into your GI tract during the procedure and reduce the bloating. If you had a lower endoscopy (such as a colonoscopy or flexible sigmoidoscopy) you may notice spotting of blood in your stool or on the toilet paper. If you underwent a bowel prep for your procedure, you may not have a normal bowel movement for a few days.  Please Note:  You might notice some irritation and congestion in your nose or some drainage.  This is from the oxygen used during your procedure.  There is no need for concern and it should clear up in a day or so.  SYMPTOMS TO REPORT IMMEDIATELY:  Following lower endoscopy (colonoscopy or flexible sigmoidoscopy):  Excessive amounts of blood in the stool  Significant tenderness or worsening of abdominal pains  Swelling of the abdomen that is new, acute  Fever of 100F or higher   For urgent or emergent issues, a gastroenterologist can be reached at any hour by calling [phone]. Do not use MyChart messaging for urgent concerns.    DIET:  We do recommend a small meal at first, but then you may proceed to your regular diet.  Drink plenty of fluids but you should avoid alcoholic beverages for 24 hours.  ACTIVITY:  You should plan to take it easy for the rest  of today and you should NOT DRIVE or use heavy machinery until tomorrow (because of the sedation medicines used during the test).    FOLLOW UP: Our staff will call the number listed on your records 48-72 hours following your procedure to check on you and address any questions or concerns that you may have regarding the information given to you following your procedure. If we do not reach you, we will leave a message.  We will attempt to reach you two times.  During this call, we will ask if you have developed any symptoms of COVID 19. If you develop any symptoms (ie: fever, flu-like symptoms, shortness of breath, cough etc.) before then, please call [phone].  If you test positive for Covid 19 in the 2 weeks post procedure, please call and report this information to us.    If any biopsies were taken you will be contacted by phone or by letter within the next 1-3 weeks.  Please call us at [phone] if you have not heard about the biopsies in 3 weeks.    SIGNATURES/CONFIDENTIALITY: You and/or your care partner have signed paperwork which will be entered into your electronic medical record.  These signatures attest to the fact that that the information above on your After Visit Summary has been reviewed and is understood.  Full responsibility of the confidentiality of this discharge information lies with you and/or your care-partner.  

## 2019-06-12 ENCOUNTER — Telehealth: Payer: Self-pay

## 2019-06-12 NOTE — Telephone Encounter (Signed)
  Follow up Call-  Call back number 06/10/2019  Post procedure Call Back phone  # [phone]  Permission to leave phone message Yes  Some recent data might be hidden     Patient questions:  Do you have a fever, pain , or abdominal swelling? No. Pain Score  0 *  Have you tolerated food without any problems? Yes.    Have you been able to return to your normal activities? Yes.    Do you have any questions about your discharge instructions: Diet   No. Medications  No. Follow up visit  No.  Do you have questions or concerns about your Care? No.  Actions: * If pain score is 4 or above: No action needed, pain <4.   1. Have you developed a fever since your procedure? no  2.   Have you had an respiratory symptoms (SOB or cough) since your procedure? no  3.   Have you tested positive for COVID 19 since your procedure no  4.   Have you had any family members/close contacts diagnosed with the COVID 19 since your procedure?  no   If yes to any of these questions please route to Joylene John, RN and Erenest Rasher, RN

## 2019-06-12 NOTE — Telephone Encounter (Signed)
Called [phone] and left a messaged we tried to reach pt for a follow up call. maw

## 2019-06-13 ENCOUNTER — Encounter: Payer: Self-pay | Admitting: Gastroenterology

## 2019-11-23 ENCOUNTER — Other Ambulatory Visit: Payer: Self-pay

## 2019-11-23 ENCOUNTER — Encounter (HOSPITAL_COMMUNITY): Payer: Self-pay | Admitting: Emergency Medicine

## 2019-11-23 ENCOUNTER — Emergency Department (HOSPITAL_COMMUNITY)
Admission: EM | Admit: 2019-11-23 | Discharge: 2019-11-23 | Disposition: A | Discharge status: Home / Self Care | Payer: BC Managed Care – PPO | Attending: Emergency Medicine | Admitting: Emergency Medicine

## 2019-11-23 DIAGNOSIS — N6459 Other signs and symptoms in breast: Secondary | ICD-10-CM | POA: Insufficient documentation

## 2019-11-23 DIAGNOSIS — F17210 Nicotine dependence, cigarettes, uncomplicated: Secondary | ICD-10-CM | POA: Diagnosis not present

## 2019-11-23 DIAGNOSIS — L03313 Cellulitis of chest wall: Secondary | ICD-10-CM | POA: Insufficient documentation

## 2019-11-23 DIAGNOSIS — Z7984 Long term (current) use of oral hypoglycemic drugs: Secondary | ICD-10-CM | POA: Insufficient documentation

## 2019-11-23 DIAGNOSIS — I10 Essential (primary) hypertension: Secondary | ICD-10-CM | POA: Insufficient documentation

## 2019-11-23 DIAGNOSIS — R21 Rash and other nonspecific skin eruption: Secondary | ICD-10-CM | POA: Insufficient documentation

## 2019-11-23 DIAGNOSIS — L539 Erythematous condition, unspecified: Secondary | ICD-10-CM

## 2019-11-23 DIAGNOSIS — N61 Mastitis without abscess: Secondary | ICD-10-CM

## 2019-11-23 DIAGNOSIS — Z79899 Other long term (current) drug therapy: Secondary | ICD-10-CM | POA: Diagnosis not present

## 2019-11-23 DIAGNOSIS — E119 Type 2 diabetes mellitus without complications: Secondary | ICD-10-CM | POA: Diagnosis not present

## 2019-11-23 DIAGNOSIS — N644 Mastodynia: Secondary | ICD-10-CM | POA: Diagnosis not present

## 2019-11-23 MED ORDER — DOXYCYCLINE HYCLATE 100 MG PO CAPS: 100.0000 mg | ORAL_CAPSULE | Freq: Two times a day (BID) | ORAL | 0 refills | Status: AC

## 2019-11-23 MED ORDER — DOXYCYCLINE HYCLATE 100 MG PO TABS
100.0000 mg | ORAL_TABLET | Freq: Once | ORAL | Status: AC
  Administered 2019-11-23: 100 mg via ORAL
  Filled 2019-11-23: qty 1

## 2019-11-23 NOTE — ED Triage Notes (Signed)
Per pt, states right breast pain since yesterday-states it is red and tender-last mammo was in March

## 2019-11-23 NOTE — ED Provider Notes (Signed)
[facility] DEPT Provider Note   CSN: [id number] Arrival date & time: 11/23/19  7741     History Chief Complaint  Patient presents with  . Breast Pain    Samantha Rice is a 54 y.o. female.  The history is provided by the patient and medical records. No language interpreter was used.  Rash Location:  Torso Torso rash location:  R breast Quality: draining, painful and redness   Pain details:    Quality:  Aching   Severity:  Moderate   Onset quality:  Gradual   Duration:  2 days   Timing:  Constant   Progression:  Unchanged Severity:  Moderate Onset quality:  Gradual Timing:  Constant Progression:  Spreading Chronicity:  New Relieved by:  Nothing Worsened by:  Nothing Ineffective treatments:  None tried Associated symptoms: no abdominal pain, no diarrhea, no fatigue, no fever, no headaches, no induration, no nausea, no shortness of breath, not vomiting and not wheezing        Past Medical History:  Diagnosis Date  . Diabetes mellitus without complication ([place])   . Hypertension     Patient Active Problem List   Diagnosis Date Noted  . Preventative health care 10/17/2010    Past Surgical History:  Procedure Laterality Date  . c cection       OB History   No obstetric history on file.     Family History  Problem Relation Age of Onset  . Dementia Mother   . Diabetes Father   . Crohn's disease Sister   . Diabetes Brother   . Diabetes Paternal Grandmother   . Colon cancer Neg Hx   . Colon polyps Neg Hx   . Esophageal cancer Neg Hx   . Rectal cancer Neg Hx   . Stomach cancer Neg Hx     Social History   Tobacco Use  . Smoking status: Current Every Day Smoker    Packs/day: 0.50    Types: Cigarettes  . Smokeless tobacco: Never Used  Vaping Use  . Vaping Use: Never used  Substance Use Topics  . Alcohol use: No  . Drug use: No    Home Medications Prior to Admission medications   Medication Sig Start Date End  Date Taking? Authorizing Provider  amLODipine (NORVASC) 5 MG tablet Take 1 tablet (5 mg total) by mouth daily. 02/14/18   Fulp, Cammie, MD  ciclopirox (PENLAC) 8 % solution Apply topically. 01/17/19   [provider]  clotrimazole-betamethasone (LOTRISONE) cream Apply topically. 08/20/18   [provider]  fluticasone (FLONASE) 50 MCG/ACT nasal spray Place 2 sprays into both nostrils daily.    [provider]  ibuprofen (ADVIL,MOTRIN) 800 MG tablet Take 1 tablet (800 mg total) by mouth 3 (three) times daily. 11/29/17   Law, Bea Graff, PA-C  metFORMIN (GLUCOPHAGE) 500 MG tablet Take 1 tablet (500 mg total) by mouth 2 (two) times daily with a meal. 02/14/18   Fulp, Cammie, MD  valACYclovir (VALTREX) 500 MG tablet TAKE 1 TABLET (500 MG TOTAL) BY MOUTH DAILY. CAN INCREASE TO 2TIMES A DAY IF OUTBREAK FOR 3 DAYS 07/04/18   [provider]    Allergies    Naproxen  Review of Systems   Review of Systems  Constitutional: Negative for chills, diaphoresis, fatigue and fever.  HENT: Negative for congestion.   Eyes: Negative for visual disturbance.  Respiratory: Negative for cough, choking, chest tightness, shortness of breath and wheezing.   Cardiovascular: Negative for chest pain, palpitations  and leg swelling.  Gastrointestinal: Negative for abdominal pain, constipation, diarrhea, nausea and vomiting.  Genitourinary: Negative for flank pain.  Musculoskeletal: Negative for back pain, neck pain and neck stiffness.  Skin: Positive for rash. Negative for wound.  Neurological: Negative for light-headedness and headaches.  Psychiatric/Behavioral: Negative for agitation.  All other systems reviewed and are negative.   Physical Exam Updated Vital Signs BP (!) 162/93 (BP Location: Left Arm)   Pulse 86   Temp 98.6 F (37 C) (Oral)   Resp 16   LMP 03/14/2014   SpO2 98%   Physical Exam Vitals and nursing note reviewed. Exam conducted with a chaperone present.    Constitutional:      General: She is not in acute distress.    Appearance: She is well-developed. She is not ill-appearing, toxic-appearing or diaphoretic.  HENT:     Head: Normocephalic and atraumatic.     Nose: No congestion.  Eyes:     Conjunctiva/sclera: Conjunctivae normal.  Cardiovascular:     Rate and Rhythm: Normal rate and regular rhythm.     Pulses: Normal pulses.     Heart sounds: No murmur heard.   Pulmonary:     Effort: Pulmonary effort is normal. No respiratory distress.     Breath sounds: Normal breath sounds. No wheezing, rhonchi or rales.  Chest:     Chest wall: Tenderness present. No lacerations, crepitus or edema.     Breasts:        Right: Skin change and tenderness present. No swelling, bleeding, inverted nipple, mass or nipple discharge.     Abdominal:     General: Abdomen is flat.     Palpations: Abdomen is soft.     Tenderness: There is no abdominal tenderness.  Musculoskeletal:        General: Tenderness present.     Cervical back: Neck supple. No tenderness.     Right lower leg: No edema.     Left lower leg: No edema.  Lymphadenopathy:     Upper Body:     Right upper body: No supraclavicular, axillary or pectoral adenopathy.  Skin:    General: Skin is warm and dry.     Capillary Refill: Capillary refill takes less than 2 seconds.     Findings: Erythema present.  Neurological:     Mental Status: She is alert and oriented to person, place, and time.  Psychiatric:        Mood and Affect: Mood normal.     ED Results / Procedures / Treatments   Labs (all labs ordered are listed, but only abnormal results are displayed) Labs Reviewed - No data to display  EKG None  Radiology No results found.  Procedures Procedures (including critical care time)  Medications Ordered in ED Medications  doxycycline (VIBRA-TABS) tablet 100 mg (100 mg Oral Given 11/23/19 6761)    ED Course  I have reviewed the triage vital signs and the nursing  notes.  Pertinent labs & imaging results that were available during my care of the patient were reviewed by me and considered in my medical decision making (see chart for details).    MDM Rules/Calculators/A&P                          Samantha Rice is a 54 y.o. female with a past medical history significant for hypertension diabetes who presents with right breast pain.  She reports that for the last 2 days, she has had  pain and redness to the right breast near the areola that today started having some redness going towards her axilla.  She also reports there is a small area that started to drain a small amount.  She reports it is painful but she denies fevers or chills.  She denies underlying congestion, cough, sore throat, or other chest pain.  She denies shortness of breath.  She denies other complaints.  She does report that she has had skin infections and boils in the past.  She is never had a breast problem.  She reports she had a previous mammogram several months ago that was reportedly normal.  On exam, with a chaperone, patient has some erythema going from her right areola several centimeters towards her shoulder.  It is slightly tender but there is no crepitance.  There was no palpable abscess but there was a small amount of drainage from near the nipple.  No actual drainage coming from the nipple itself.  No other breast masses or palpable nodules.  No abscesses felt.  Lungs clear and chest nontender otherwise.  Exam otherwise unremarkable.  Clinical aspect she has a cellulitis that she is developing that is having some streaking towards her chest.  We did not feel an abscess requiring intervention or drainage at this time but there was a small amount of drainage from earlier.  There was not enough purulence to get a good sample.  We will treat her with antibiotics and she will follow up with the breast center.  She agrees with this plan as well strict return precautions were any new or  worsened symptoms.  Patient had no questions or concerns and was discharged in good condition.       Final Clinical Impression(s) / ED Diagnoses Final diagnoses:  Cellulitis of breast  Breast erythema  Breast pain, right    Rx / DC Orders ED Discharge Orders         Ordered    doxycycline (VIBRAMYCIN) 100 MG capsule  2 times daily        11/23/19 0849          Clinical Impression: 1. Cellulitis of breast   2. Breast erythema   3. Breast pain, right     Disposition: Discharge  Condition: Good  I have discussed the results, Dx and Tx plan with the pt(& family if present). He/she/they expressed understanding and agree(s) with the plan. Discharge instructions discussed at great length. Strict return precautions discussed and pt &/or family have verbalized understanding of the instructions. No further questions at time of discharge.    New Prescriptions   DOXYCYCLINE (VIBRAMYCIN) 100 MG CAPSULE    Take 1 capsule (100 mg total) by mouth 2 (two) times daily for 7 days.    Follow Up: Imaging, [facility] [place] [address] [phone]     [facility]EMERGENC[facility] DEPT [place] [id number] mc [place]       Kyre Jeffries, Gwenyth Allegra, MD 11/23/19 [id number]

## 2019-11-23 NOTE — Discharge Instructions (Signed)
Your history and exam today are consistent with a skin infection or cellulitis of the right breast.  There was not small area of drainage however we did not feel a pocket of infection called an abscess to drain today.  Please take the antibiotics and watch the redness and streaking.  Please watch for signs of worsening infection or worsening pain.  Please take the antibiotics.  Please follow-up with the breast center.  If any symptoms change or worsen acutely, please return to the nearest emergency department.

## 2019-11-24 ENCOUNTER — Other Ambulatory Visit: Payer: Self-pay | Admitting: Emergency Medicine

## 2019-11-24 ENCOUNTER — Other Ambulatory Visit: Payer: Self-pay | Admitting: Family Medicine

## 2019-11-24 DIAGNOSIS — Z1231 Encounter for screening mammogram for malignant neoplasm of breast: Secondary | ICD-10-CM

## 2020-10-29 IMAGING — CT CT ABD-PELV W/ CM
2 of 5 series · 17 of 46 positions shown, 19 images · IV contrast (iopamidol)
Comparison: 5255 CT abdomen and pelvis. 03/02/2011 MRI abdomen.

CLINICAL DATA: 52 y/o F; patient notes bumps in her stomach from 1
month ago. Abdominal distention.

EXAM:
CT ABDOMEN AND PELVIS WITH CONTRAST
TECHNIQUE: Multidetector CT imaging of the abdomen and pelvis was performed
using the standard protocol following bolus administration of
intravenous contrast.
CONTRAST:  100mL BEZ8SN-DII IOPAMIDOL (BEZ8SN-DII) INJECTION 61%

[Series 3: axial st · axial · 0.96mm/px · z∈[+1076,+1436]mm · 14 of 84 slices shown, 16 images]
[im 6/84  soft-tissue]
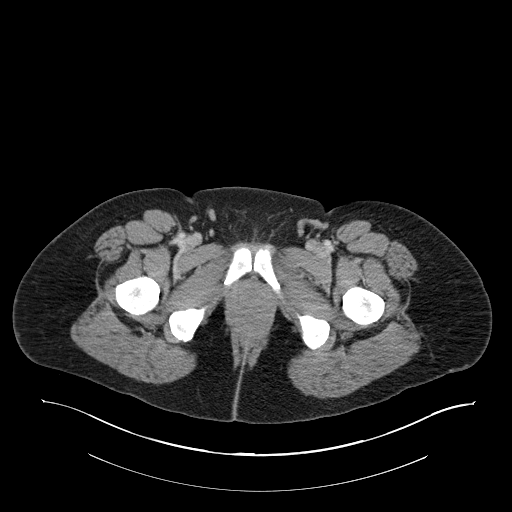
[im 6/84  bone]
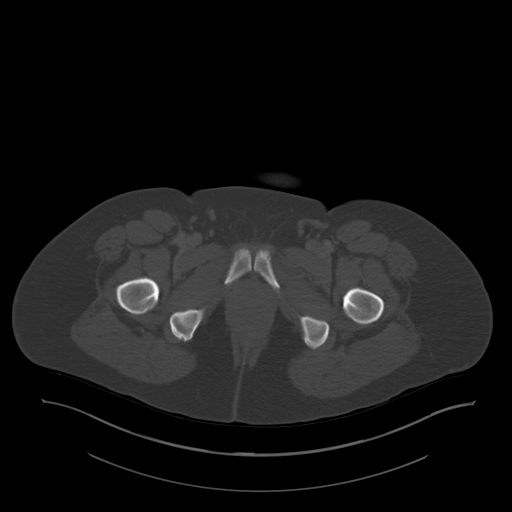
[im 11/84  soft-tissue]
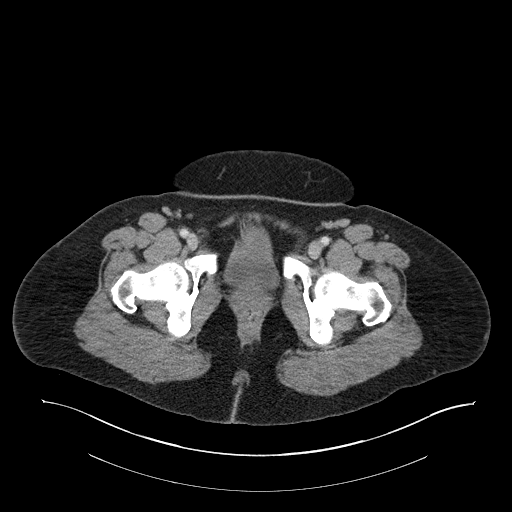
[im 16/84  soft-tissue]
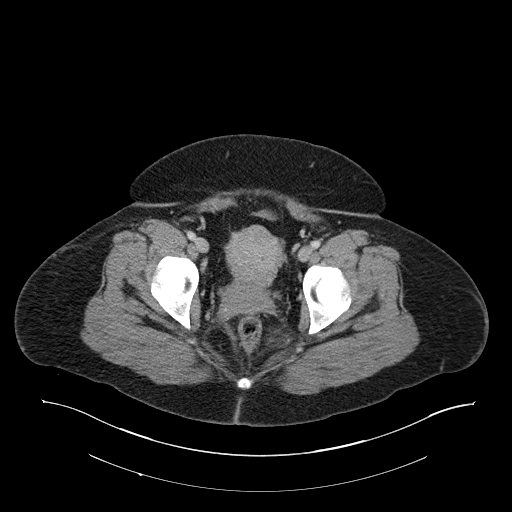
[im 21/84  soft-tissue]
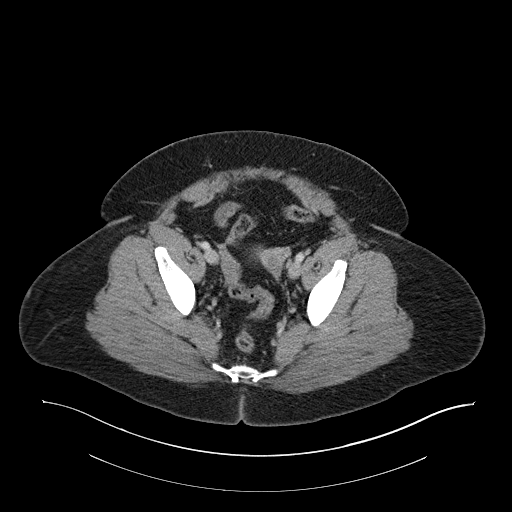
[im 26/84  soft-tissue]
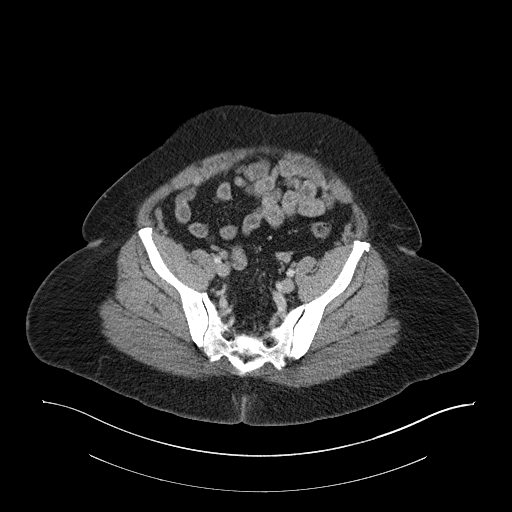
[im 32/84  soft-tissue]
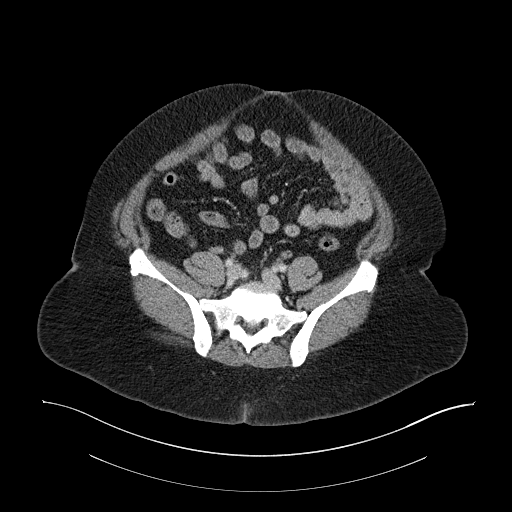
[im 37/84  soft-tissue]
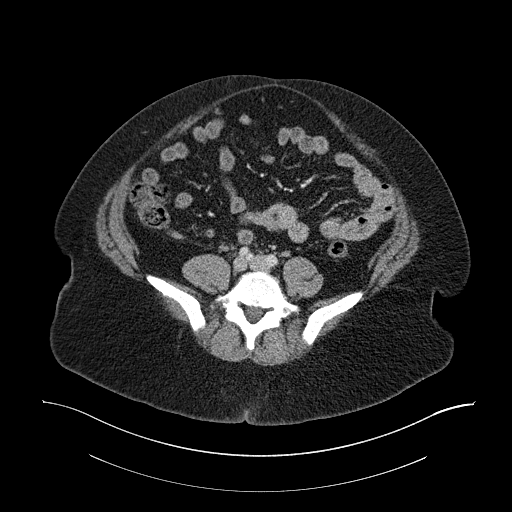
[im 47/84  soft-tissue]
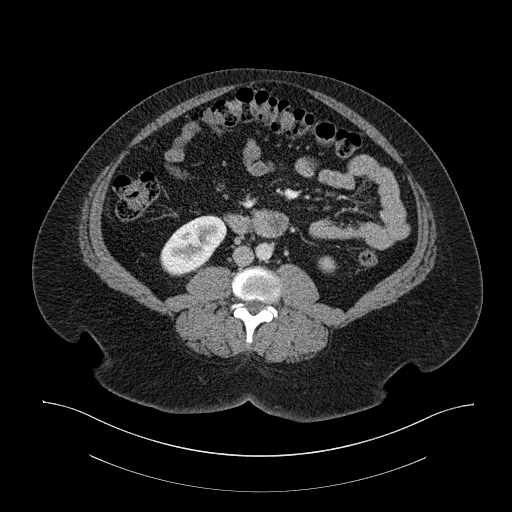
[im 52/84  soft-tissue]
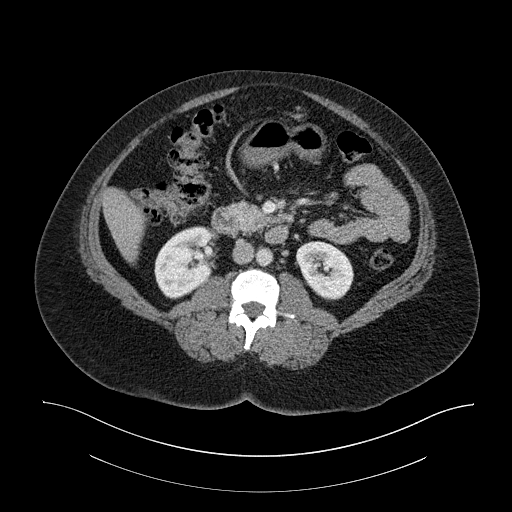
[im 52/84  bone]
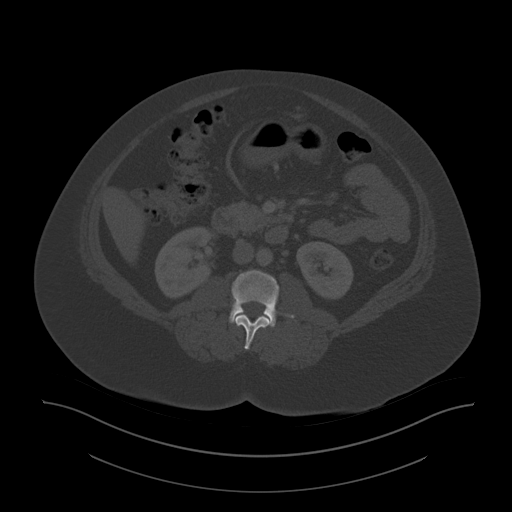
[im 58/84  soft-tissue]
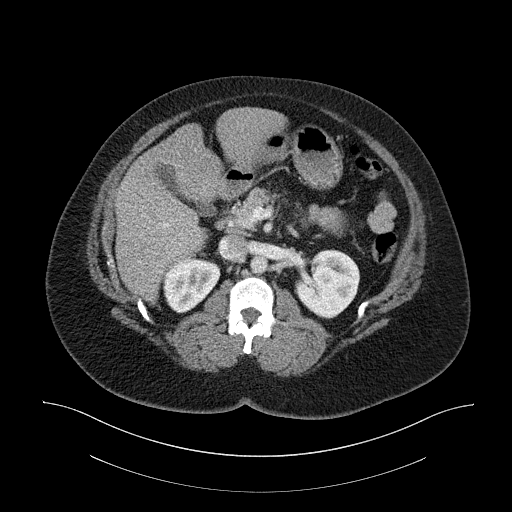
[im 63/84  soft-tissue]
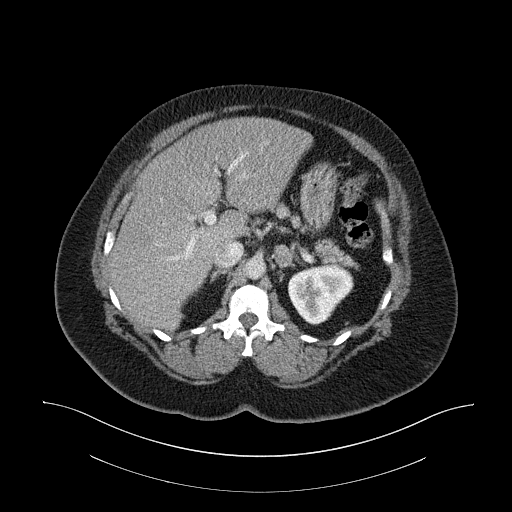
[im 68/84  soft-tissue]
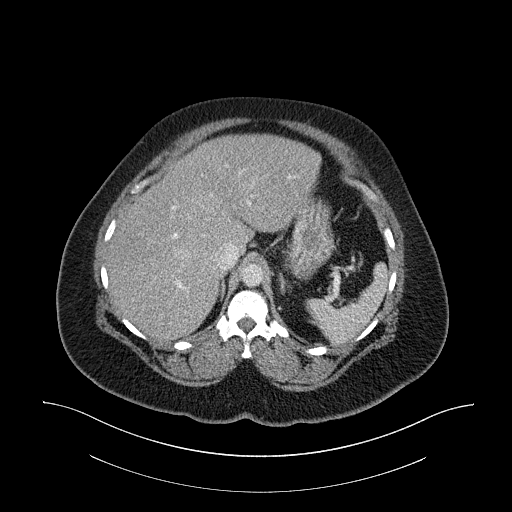
[im 73/84  soft-tissue]
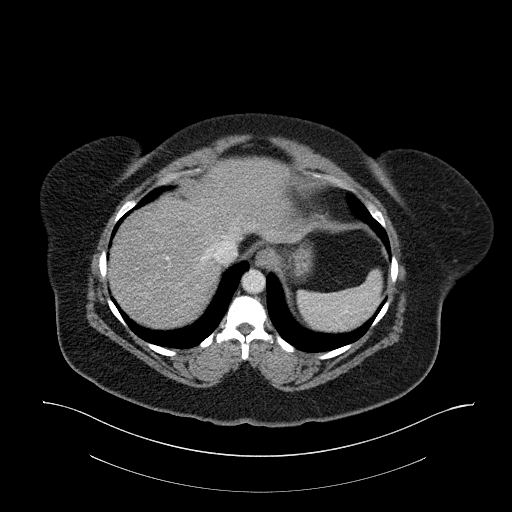
[im 78/84  soft-tissue]
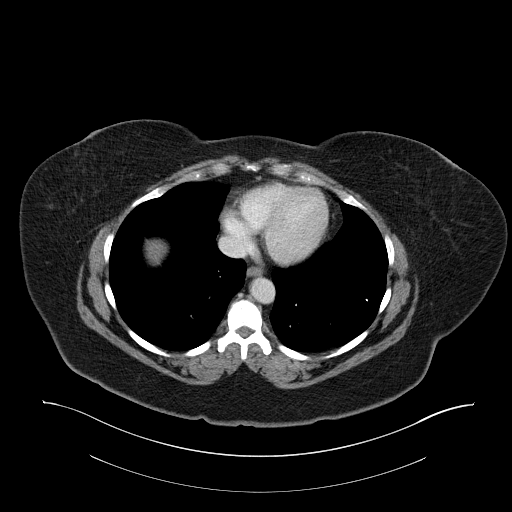

[Series 6: coronal st · coronal · 0.85mm/px · 3 of 119 slices shown]
[im 40/119  soft-tissue]
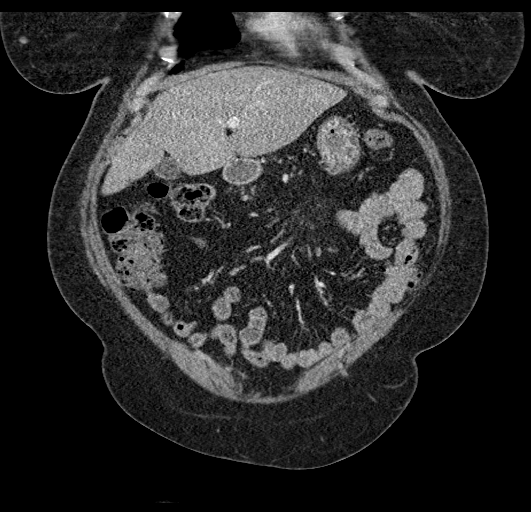
[im 53/119  soft-tissue]
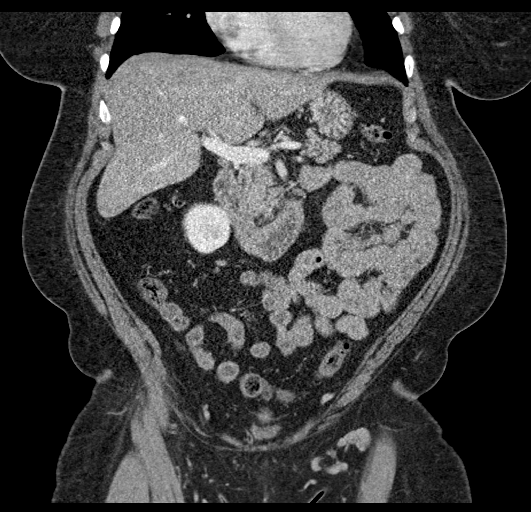
[im 66/119  soft-tissue]
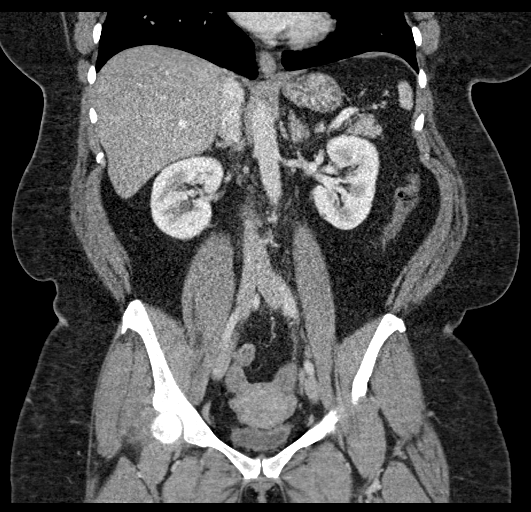

[17 of 46 positions shown; findings below may reference images not displayed]

FINDINGS: Lower chest: No acute abnormality.

Hepatobiliary: No focal liver abnormality is seen. No gallstones,
gallbladder wall thickening, or biliary dilatation.

Pancreas: Unremarkable. No pancreatic ductal dilatation or
surrounding inflammatory changes.

Spleen: Normal in size without focal abnormality.

Adrenals/Urinary Tract: Stable 2.1 cm left adrenal adenoma. Normal
right adrenal gland. No focal kidney abnormality. No urinary stone
disease or hydronephrosis. Bladder is collapsed and unremarkable.

Stomach/Bowel: Stomach is within normal limits. Appendix appears
normal. No evidence of bowel wall thickening, distention, or
inflammatory changes.

Vascular/Lymphatic: Aortic atherosclerosis. No enlarged abdominal or
pelvic lymph nodes.

Reproductive: Uterus and bilateral adnexa are unremarkable.

Other: No abdominal wall hernia or abnormality. No abdominopelvic
ascites. There are a few tiny subcentimeter nodules within the
subcutaneous fat of the mid anterior abdominal wall near the dermis
(series 3, image 34, 36, 39).

Musculoskeletal: No acute or significant osseous findings.
IMPRESSION: 1. No acute process identified.
2. Few tiny subcentimeter nodules within subcutaneous fat of the mid
anterior abdominal wall, possibly dermal cysts, fat necrosis, or
injection sites.
3. Stable left adrenal adenoma.
4. Aortic Atherosclerosis ([id number]).

## 2021-01-30 ENCOUNTER — Emergency Department (HOSPITAL_COMMUNITY)
Admission: EM | Admit: 2021-01-30 | Discharge: 2021-01-30 | Disposition: A | Discharge status: Home / Self Care | Payer: BC Managed Care – PPO | Attending: Emergency Medicine | Admitting: Emergency Medicine

## 2021-01-30 ENCOUNTER — Encounter (HOSPITAL_COMMUNITY): Payer: Self-pay

## 2021-01-30 ENCOUNTER — Other Ambulatory Visit: Payer: Self-pay

## 2021-01-30 DIAGNOSIS — Z5321 Procedure and treatment not carried out due to patient leaving prior to being seen by health care provider: Secondary | ICD-10-CM | POA: Insufficient documentation

## 2021-01-30 DIAGNOSIS — R059 Cough, unspecified: Secondary | ICD-10-CM | POA: Insufficient documentation

## 2021-01-30 DIAGNOSIS — M791 Myalgia, unspecified site: Secondary | ICD-10-CM | POA: Insufficient documentation

## 2021-01-30 DIAGNOSIS — Z20822 Contact with and (suspected) exposure to covid-19: Secondary | ICD-10-CM | POA: Insufficient documentation

## 2021-01-30 NOTE — ED Notes (Signed)
Had discharged pt LWBS after triage due to not being present when called for a room earlier, per registration pt was in the lobby, registration attempted to call pt for a room and call her phone number, no response and not visualized in lobby.

## 2021-01-30 NOTE — ED Triage Notes (Signed)
Patient arrived after having a cough, cold, chills, body aches for 3 days. She has taken a bunch of medications but has not improved. Patient took tylenol last night at 10pm.

## 2021-04-16 ENCOUNTER — Encounter (HOSPITAL_COMMUNITY): Payer: Self-pay | Admitting: Emergency Medicine

## 2021-04-16 ENCOUNTER — Emergency Department (HOSPITAL_COMMUNITY)
Admission: EM | Admit: 2021-04-16 | Discharge: 2021-04-16 | Disposition: A | Discharge status: Home / Self Care | Payer: Medicaid Other | Attending: Emergency Medicine | Admitting: Emergency Medicine

## 2021-04-16 ENCOUNTER — Emergency Department (HOSPITAL_COMMUNITY): Payer: Medicaid Other

## 2021-04-16 DIAGNOSIS — N3001 Acute cystitis with hematuria: Secondary | ICD-10-CM

## 2021-04-16 DIAGNOSIS — U071 COVID-19: Secondary | ICD-10-CM

## 2021-04-16 DIAGNOSIS — Z79899 Other long term (current) drug therapy: Secondary | ICD-10-CM | POA: Insufficient documentation

## 2021-04-16 DIAGNOSIS — R8289 Other abnormal findings on cytological and histological examination of urine: Secondary | ICD-10-CM | POA: Insufficient documentation

## 2021-04-16 DIAGNOSIS — Z7984 Long term (current) use of oral hypoglycemic drugs: Secondary | ICD-10-CM | POA: Insufficient documentation

## 2021-04-16 DIAGNOSIS — I10 Essential (primary) hypertension: Secondary | ICD-10-CM | POA: Insufficient documentation

## 2021-04-16 DIAGNOSIS — D72819 Decreased white blood cell count, unspecified: Secondary | ICD-10-CM | POA: Insufficient documentation

## 2021-04-16 LAB — CBC WITH DIFFERENTIAL/PLATELET
Abs Immature Granulocytes: 0.02 10*3/uL (ref 0.00–0.07)
Basophils Absolute: 0 10*3/uL (ref 0.0–0.1)
Basophils Relative: 1 %
Eosinophils Absolute: 0.1 10*3/uL (ref 0.0–0.5)
Eosinophils Relative: 2 %
HCT: 41.2 % (ref 36.0–46.0)
Hemoglobin: 13.3 g/dL (ref 12.0–15.0)
Immature Granulocytes: 1 %
Lymphocytes Relative: 29 %
Lymphs Abs: 1.1 10*3/uL (ref 0.7–4.0)
MCH: 29 pg (ref 26.0–34.0)
MCHC: 32.3 g/dL (ref 30.0–36.0)
MCV: 89.8 fL (ref 80.0–100.0)
Monocytes Absolute: 0.7 10*3/uL (ref 0.1–1.0)
Monocytes Relative: 17 %
Neutro Abs: 2 10*3/uL (ref 1.7–7.7)
Neutrophils Relative %: 50 %
Platelets: 178 10*3/uL (ref 150–400)
RBC: 4.59 MIL/uL (ref 3.87–5.11)
RDW: 13.1 % (ref 11.5–15.5)
WBC: 3.9 10*3/uL — ABNORMAL LOW (ref 4.0–10.5)
nRBC: 0 % (ref 0.0–0.2)

## 2021-04-16 LAB — COMPREHENSIVE METABOLIC PANEL
ALT: 31 U/L (ref 0–44)
AST: 31 U/L (ref 15–41)
Albumin: 4 g/dL (ref 3.5–5.0)
Alkaline Phosphatase: 68 U/L (ref 38–126)
Anion gap: 9 (ref 5–15)
BUN: 9 mg/dL (ref 6–20)
CO2: 25 mmol/L (ref 22–32)
Calcium: 9.2 mg/dL (ref 8.9–10.3)
Chloride: 101 mmol/L (ref 98–111)
Creatinine, Ser: 0.56 mg/dL (ref 0.44–1.00)
GFR, Estimated: >60 mL/min (ref >60)
Glucose, Bld: 202 mg/dL — ABNORMAL HIGH (ref 70–99)
Potassium: 3.5 mmol/L (ref 3.5–5.1)
Sodium: 135 mmol/L (ref 135–145)
Total Bilirubin: 0.5 mg/dL (ref 0.3–1.2)
Total Protein: 7.5 g/dL (ref 6.5–8.1)

## 2021-04-16 LAB — URINALYSIS, ROUTINE W REFLEX MICROSCOPIC
Bilirubin Urine: NEGATIVE
Glucose, UA: 150 mg/dL — AB
Ketones, ur: NEGATIVE mg/dL
Nitrite: NEGATIVE
Protein, ur: NEGATIVE mg/dL
Specific Gravity, Urine: 1.018 (ref 1.005–1.030)
pH: 5 (ref 5.0–8.0)

## 2021-04-16 LAB — RESP PANEL BY RT-PCR (FLU A&B, COVID) ARPGX2
Influenza A by PCR: NEGATIVE
Influenza B by PCR: NEGATIVE
SARS Coronavirus 2 by RT PCR: POSITIVE — AB

## 2021-04-16 MED ORDER — IBUPROFEN 800 MG PO TABS
800.0000 mg | ORAL_TABLET | Freq: Once | ORAL | Status: AC
  Administered 2021-04-16: 800 mg via ORAL
  Filled 2021-04-16: qty 1

## 2021-04-16 MED ORDER — AMLODIPINE BESYLATE 5 MG PO TABS
5.0000 mg | ORAL_TABLET | Freq: Once | ORAL | Status: AC
  Administered 2021-04-16: 5 mg via ORAL
  Filled 2021-04-16: qty 1

## 2021-04-16 MED ORDER — CEPHALEXIN 500 MG PO CAPS: 500.0000 mg | ORAL_CAPSULE | Freq: Two times a day (BID) | ORAL | 0 refills | Status: AC

## 2021-04-16 NOTE — ED Provider Notes (Signed)
[facility] DEPT Provider Note   CSN: [id number] Arrival date & time: 04/16/21  0257     History  Chief Complaint  Patient presents with   Generalized Body Aches    Samantha Rice is a 56 y.o. female.  Presenting to ER with concern for body aches.  States that she has been feeling generally unwell for about 6 days or so at this point.  Has had chills, cough, body aches.  States that she feels like her whole body is aching.  Took at home COVID test and it was negative.  Denies any chest pain or difficulty in breathing.  Has had some congestion.  No burning with urination or flank pain.  Maybe some urinary frequency.  Has history of high blood pressure  HPI     Home Medications Prior to Admission medications   Medication Sig Start Date End Date Taking? Authorizing Provider  amLODipine (NORVASC) 5 MG tablet Take 1 tablet (5 mg total) by mouth daily. 02/14/18  Yes Fulp, Cammie, MD  cephALEXin (KEFLEX) 500 MG capsule Take 1 capsule (500 mg total) by mouth 2 (two) times daily for 5 days. 04/16/21 04/21/21 Yes Regan Lemming, MD  fluticasone (FLONASE) 50 MCG/ACT nasal spray Place 2 sprays into both nostrils daily as needed for allergies.   Yes [provider]  metFORMIN (GLUCOPHAGE) 500 MG tablet Take 1 tablet (500 mg total) by mouth 2 (two) times daily with a meal. 02/14/18  Yes Fulp, Cammie, MD  valACYclovir (VALTREX) 500 MG tablet Take 500 mg by mouth daily as needed (for outbreaks). 07/04/18  Yes [provider]  ciclopirox (PENLAC) 8 % solution Apply topically. 01/17/19   [provider]  clotrimazole-betamethasone (LOTRISONE) cream Apply topically. 08/20/18   [provider]  ibuprofen (ADVIL,MOTRIN) 800 MG tablet Take 1 tablet (800 mg total) by mouth 3 (three) times daily. 11/29/17   Frederica Kuster, PA-C      Allergies    Naproxen    Review of Systems   Review of Systems  Constitutional:  Positive for chills and  fatigue.  Musculoskeletal:  Positive for myalgias.  All other systems reviewed and are negative.  Physical Exam Updated Vital Signs BP (!) 151/86    Pulse 81    Temp 98.3 F (36.8 C) (Oral)    Resp 19    Ht 5\' 1"  (1.549 m)    Wt 93 kg    LMP 03/14/2014    SpO2 97%    BMI 38.73 kg/m  Physical Exam Vitals and nursing note reviewed.  Constitutional:      General: She is not in acute distress.    Appearance: She is well-developed.  HENT:     Head: Normocephalic and atraumatic.  Eyes:     Conjunctiva/sclera: Conjunctivae normal.  Cardiovascular:     Rate and Rhythm: Normal rate and regular rhythm.     Heart sounds: No murmur heard. Pulmonary:     Effort: Pulmonary effort is normal. No respiratory distress.     Breath sounds: Normal breath sounds.  Abdominal:     Palpations: Abdomen is soft.     Tenderness: There is no abdominal tenderness.  Musculoskeletal:        General: No swelling.     Cervical back: Neck supple.  Skin:    General: Skin is warm and dry.     Capillary Refill: Capillary refill takes less than 2 seconds.  Neurological:     Mental Status: She is alert.  Psychiatric:        Mood and Affect: Mood normal.    ED Results / Procedures / Treatments   Labs (all labs ordered are listed, but only abnormal results are displayed) Labs Reviewed  RESP PANEL BY RT-PCR (FLU A&B, COVID) ARPGX2 - Abnormal; Notable for the following components:      Result Value   SARS Coronavirus 2 by RT PCR POSITIVE (*)    All other components within normal limits  CBC WITH DIFFERENTIAL/PLATELET - Abnormal; Notable for the following components:   WBC 3.9 (*)    All other components within normal limits  COMPREHENSIVE METABOLIC PANEL - Abnormal; Notable for the following components:   Glucose, Bld 202 (*)    All other components within normal limits  URINALYSIS, ROUTINE W REFLEX MICROSCOPIC - Abnormal; Notable for the following components:   APPearance HAZY (*)    Glucose, UA 150 (*)     Hgb urine dipstick SMALL (*)    Leukocytes,Ua LARGE (*)    Bacteria, UA RARE (*)    All other components within normal limits    EKG None  Radiology No results found.  Procedures Procedures    Medications Ordered in ED Medications  amLODipine (NORVASC) tablet 5 mg (5 mg Oral Given 04/16/21 0446)  ibuprofen (ADVIL) tablet 800 mg (800 mg Oral Given 04/16/21 0446)    ED Course/ Medical Decision Making/ A&P                           Medical Decision Making Amount and/or Complexity of Data Reviewed Labs: ordered. Radiology: ordered.  Risk Prescription drug management.   56 y/o lady with myalgias, cough, congestion. On exam, she appears well in no distress. Vitals are grossly stable. Symptoms strongly suggestive of acute viral process. Given duration of symptoms (almost one week), significant fatigue and aches, will check basic labs. No anemia noted, there is slight leukopenia common in viral illness like covid. Cxr independently reviewed and interpretted - looks clear without infiltrate. Covid is posistive. Suspect this is source for all of her symptoms today. Her UA is pending at time of sign out. Dr. Armandina Gemma will fu this result. As far as covid dx, considered paxlovid but patient has had symptoms for almost a week and outside window where this medication is felt to be beneficial. Anticipate she is appropriate for dc.         Final Clinical Impression(s) / ED Diagnoses Final diagnoses:  COVID-19  Acute cystitis with hematuria    Rx / DC Orders ED Discharge Orders          Ordered    cephALEXin (KEFLEX) 500 MG capsule  2 times daily        04/16/21 0749              Lucrezia Starch, MD 04/18/21 [phone]

## 2021-04-16 NOTE — Discharge Instructions (Addendum)
Follow-up with your primary care doctor.  Come back to ER if you develop difficulty breathing, chest pain or other new concerning symptom.

## 2021-04-16 NOTE — ED Provider Notes (Signed)
°  Physical Exam  BP (!) 153/88    Pulse 79    Temp 98.3 F (36.8 C) (Oral)    Resp 19    Ht 5\' 1"  (1.549 m)    Wt 93 kg    LMP 03/14/2014    SpO2 97%    BMI 38.73 kg/m     Procedures  Procedures  ED Course / MDM    Medical Decision Making Amount and/or Complexity of Data Reviewed Labs: ordered. Radiology: ordered.  Risk Prescription drug management.   56 year old female presenting to the emergency department with fever, chills, cough, congestion, body aches, COVID-positive.  She is on day 6 of symptoms.  Plan for likely discharge.  Pending urinalysis prior to discharge.  The patient's urinalysis resulted with rare bacteria, large leukocytes, negative nitrites, 11-20 WBCs.  The patient does state that she has been having some increased urinary frequency.  We will treat with Keflex.  Stable for discharge.       Regan Lemming, MD 04/16/21 [phone]

## 2021-04-16 NOTE — ED Triage Notes (Signed)
Pt reports sneezing, body aches, cough, congestion, and body  aches for about a week. She has tried OTC cold medications without relief. States that she took 2 COVID tests at home that were negative. A&Ox4. Ambulatory.

## 2021-10-26 ENCOUNTER — Encounter (HOSPITAL_COMMUNITY): Payer: Self-pay

## 2021-10-26 ENCOUNTER — Emergency Department (HOSPITAL_COMMUNITY)
Admission: EM | Admit: 2021-10-26 | Discharge: 2021-10-26 | Disposition: A | Discharge status: Home / Self Care | Payer: Medicaid Other | Attending: Emergency Medicine | Admitting: Emergency Medicine

## 2021-10-26 DIAGNOSIS — Z7984 Long term (current) use of oral hypoglycemic drugs: Secondary | ICD-10-CM | POA: Insufficient documentation

## 2021-10-26 DIAGNOSIS — Z79899 Other long term (current) drug therapy: Secondary | ICD-10-CM | POA: Insufficient documentation

## 2021-10-26 DIAGNOSIS — N390 Urinary tract infection, site not specified: Secondary | ICD-10-CM | POA: Insufficient documentation

## 2021-10-26 DIAGNOSIS — A5901 Trichomonal vulvovaginitis: Secondary | ICD-10-CM | POA: Insufficient documentation

## 2021-10-26 DIAGNOSIS — H6992 Unspecified Eustachian tube disorder, left ear: Secondary | ICD-10-CM | POA: Insufficient documentation

## 2021-10-26 DIAGNOSIS — H6982 Other specified disorders of Eustachian tube, left ear: Secondary | ICD-10-CM

## 2021-10-26 DIAGNOSIS — M62838 Other muscle spasm: Secondary | ICD-10-CM

## 2021-10-26 DIAGNOSIS — M62830 Muscle spasm of back: Secondary | ICD-10-CM | POA: Insufficient documentation

## 2021-10-26 LAB — URINALYSIS, ROUTINE W REFLEX MICROSCOPIC
Bilirubin Urine: NEGATIVE
Glucose, UA: >=500 mg/dL — AB
Ketones, ur: NEGATIVE mg/dL
Nitrite: NEGATIVE
Protein, ur: 30 mg/dL — AB
Specific Gravity, Urine: 1.031 — ABNORMAL HIGH (ref 1.005–1.030)
WBC, UA: >50 WBC/hpf — ABNORMAL HIGH (ref 0–5)
pH: 5 (ref 5.0–8.0)

## 2021-10-26 LAB — WET PREP, GENITAL
Clue Cells Wet Prep HPF POC: NONE SEEN
Sperm: NONE SEEN
WBC, Wet Prep HPF POC: >=10 — AB (ref <10)
Yeast Wet Prep HPF POC: NONE SEEN

## 2021-10-26 LAB — CBG MONITORING, ED: Glucose-Capillary: 192 mg/dL — ABNORMAL HIGH (ref 70–99)

## 2021-10-26 MED ORDER — NITROFURANTOIN MONOHYD MACRO 100 MG PO CAPS
100.0000 mg | ORAL_CAPSULE | Freq: Once | ORAL | Status: AC
  Administered 2021-10-26: 100 mg via ORAL
  Filled 2021-10-26: qty 1

## 2021-10-26 MED ORDER — METRONIDAZOLE 500 MG PO TABS
500.0000 mg | ORAL_TABLET | Freq: Once | ORAL | Status: AC
  Administered 2021-10-26: 500 mg via ORAL
  Filled 2021-10-26: qty 1

## 2021-10-26 MED ORDER — IBUPROFEN 600 MG PO TABS: 600.0000 mg | ORAL_TABLET | Freq: Three times a day (TID) | ORAL | 0 refills | Status: DC | PRN

## 2021-10-26 MED ORDER — METHOCARBAMOL 1000 MG PO TABS: 1000.0000 mg | ORAL_TABLET | Freq: Three times a day (TID) | ORAL | 0 refills | Status: DC | PRN

## 2021-10-26 MED ORDER — METRONIDAZOLE 500 MG PO TABS: 500.0000 mg | ORAL_TABLET | Freq: Two times a day (BID) | ORAL | 0 refills | Status: DC

## 2021-10-26 MED ORDER — IBUPROFEN 200 MG PO TABS
600.0000 mg | ORAL_TABLET | Freq: Once | ORAL | Status: AC
  Administered 2021-10-26: 600 mg via ORAL
  Filled 2021-10-26: qty 3

## 2021-10-26 MED ORDER — NITROFURANTOIN MONOHYD MACRO 100 MG PO CAPS: 100.0000 mg | ORAL_CAPSULE | Freq: Two times a day (BID) | ORAL | 0 refills | Status: DC

## 2021-10-26 NOTE — Discharge Instructions (Addendum)
If you develop fever, new or worsening pain, weakness or numbness in extremities, or any other new/concerning symptoms then return to the ER for evaluation.  Do not drink alcohol while on these antibiotics as they can cause a violent reaction.  Not drive or operate heavy machinery while using the muscle relaxer.

## 2021-10-26 NOTE — ED Provider Notes (Signed)
[facility] DEPT Provider Note   CSN: [id number] Arrival date & time: 10/26/21  1048     History  Chief Complaint  Patient presents with   Neck Pain   Female GU Problem    Samantha Rice is a 56 y.o. female.  HPI 56 year old female presents with multiple complaints. First is left neck/trapezius pain for about 1 month. No known injury. No chest pain, dyspnea, arm pain or arm weakness/numbness. Tried some OTC meds such as tylenol and aspirin without relief. Also having left ear pain for about 2 weeks. Keeps popping like she's in an airplane. Mildly painful when that happens. No dental/jaw pain. She has chronic sinus issues and takes zyrtec and flonase for them. 3rd complaint is concern for vaginal yeast infection. This originally occurred a couple months ago. She treated it with monostat and it went away. Since then has come and gone. There is on and off dysuria. No abdominal pain. No rash/lesions. There is some discharge but no odor.   Home Medications Prior to Admission medications   Medication Sig Start Date End Date Taking? Authorizing Provider  amLODipine (NORVASC) 5 MG tablet Take 1 tablet (5 mg total) by mouth daily. 02/14/18  Yes Fulp, Cammie, MD  fluticasone (FLONASE) 50 MCG/ACT nasal spray Place 2 sprays into both nostrils daily as needed for allergies.   Yes [provider]  ibuprofen (ADVIL) 600 MG tablet Take 1 tablet (600 mg total) by mouth every 8 (eight) hours as needed. 10/26/21  Yes Sherwood Gambler, MD  metFORMIN (GLUCOPHAGE) 500 MG tablet Take 1 tablet (500 mg total) by mouth 2 (two) times daily with a meal. 02/14/18  Yes Fulp, Cammie, MD  methocarbamol 1000 MG TABS Take 1,000 mg by mouth every 8 (eight) hours as needed for muscle spasms. 10/26/21  Yes Sherwood Gambler, MD  metroNIDAZOLE (FLAGYL) 500 MG tablet Take 1 tablet (500 mg total) by mouth 2 (two) times daily. One po bid x 7 days 10/26/21  Yes Sherwood Gambler, MD   nitrofurantoin, macrocrystal-monohydrate, (MACROBID) 100 MG capsule Take 1 capsule (100 mg total) by mouth 2 (two) times daily. 10/26/21  Yes Sherwood Gambler, MD  valACYclovir (VALTREX) 500 MG tablet Take 500 mg by mouth daily as needed (for outbreaks). 07/04/18  Yes [provider]      Allergies    Naproxen    Review of Systems   Review of Systems  Constitutional:  Negative for fever.  HENT:  Positive for ear pain. Negative for sore throat.   Respiratory:  Negative for shortness of breath.   Cardiovascular:  Negative for chest pain.  Genitourinary:  Positive for dysuria and vaginal discharge. Negative for vaginal pain.  Musculoskeletal:  Positive for neck pain.    Physical Exam Updated Vital Signs BP (!) 168/87 (BP Location: Right Arm)   Pulse 76   Temp 98.2 F (36.8 C) (Oral)   Resp 18   Ht '5\' 1"'$  (1.549 m)   Wt 87.1 kg   LMP 03/14/2014   SpO2 98%   BMI 36.28 kg/m  Physical Exam Vitals and nursing note reviewed. Exam conducted with a chaperone present.  Constitutional:      Appearance: She is well-developed.  HENT:     Head: Normocephalic and atraumatic.     Jaw: No tenderness.     Right Ear: Ear canal and external ear normal. No swelling or tenderness. Tympanic membrane is not perforated or erythematous.     Left Ear: Ear canal and  external ear normal. No swelling or tenderness. Tympanic membrane is not perforated or erythematous.     Ears:     Comments: I suspect middle ear effusion on both sides though no redness to suggest infection. Neck:   Cardiovascular:     Rate and Rhythm: Normal rate and regular rhythm.     Heart sounds: Normal heart sounds.  Pulmonary:     Effort: Pulmonary effort is normal.     Breath sounds: Normal breath sounds.  Abdominal:     Palpations: Abdomen is soft.     Tenderness: There is no abdominal tenderness.  Genitourinary:    Labia:        Right: No rash.        Left: No rash.      Vagina: Vaginal discharge present.   Musculoskeletal:     Cervical back: Normal range of motion. No rigidity. Muscular tenderness present. No spinous process tenderness.  Skin:    General: Skin is warm and dry.  Neurological:     Mental Status: She is alert.     Comments: 5/5 strength in all 4 extremities. Grossly normal sensation     ED Results / Procedures / Treatments   Labs (all labs ordered are listed, but only abnormal results are displayed) Labs Reviewed  WET PREP, GENITAL - Abnormal; Notable for the following components:      Result Value   Trich, Wet Prep PRESENT (*)    WBC, Wet Prep HPF POC >=10 (*)    All other components within normal limits  URINALYSIS, ROUTINE W REFLEX MICROSCOPIC - Abnormal; Notable for the following components:   APPearance HAZY (*)    Specific Gravity, Urine 1.031 (*)    Glucose, UA >=500 (*)    Hgb urine dipstick MODERATE (*)    Protein, ur 30 (*)    Leukocytes,Ua LARGE (*)    WBC, UA >50 (*)    Bacteria, UA RARE (*)    All other components within normal limits  CBG MONITORING, ED - Abnormal; Notable for the following components:   Glucose-Capillary 192 (*)    All other components within normal limits  URINE CULTURE  GC/CHLAMYDIA PROBE AMP ([place]) NOT AT [facility]    EKG None  Radiology No results found.  Procedures Procedures    Medications Ordered in ED Medications  ibuprofen (ADVIL) tablet 600 mg (600 mg Oral Given 10/26/21 2056)  nitrofurantoin (macrocrystal-monohydrate) (MACROBID) capsule 100 mg (100 mg Oral Given 10/26/21 2206)  metroNIDAZOLE (FLAGYL) tablet 500 mg (500 mg Oral Given 10/26/21 2207)    ED Course/ Medical Decision Making/ A&P                           Medical Decision Making Amount and/or Complexity of Data Reviewed Labs: ordered.  Risk OTC drugs. Prescription drug management.   I suspect patient's neck pain is a trapezius muscle spasm.  No obvious trigger points.  Will give NSAIDs (she can tolerate anything but naproxen including  ibuprofen) and muscle relaxer.  Highly doubt acute spinal cord process.  Doubt this is anginal, especially with it being so reproducible.  Neuro exam is unremarkable.  For her ear she likely has eustachian tube dysfunction.  No obvious otitis media.  Will refer to ENT recommend doing some modified Valsalva.  For her discharge, unfortunate looks like she has trichomonas.  I discussed this with her and discussed treatment and we will give oral Flagyl.  This could  be causing her dysuria and her urine does appear like a UTI.  Could be trichomonas but is hard to tell so I think is reasonable to treat for UTI as well.  Patient is agreeable to this.  She has some mild hyperglycemia at 192 but I do not think DKA or other metabolic abnormalities likely and do not think blood work is needed.  Will discharge home with return precautions and antibiotic and pain/muscle relaxer prescriptions.       Final Clinical Impression(s) / ED Diagnoses Final diagnoses:  Trapezius muscle spasm  Trichomonal vaginitis  Acute urinary tract infection  Dysfunction of left eustachian tube    Rx / DC Orders ED Discharge Orders          Ordered    metroNIDAZOLE (FLAGYL) 500 MG tablet  2 times daily        10/26/21 2150    nitrofurantoin, macrocrystal-monohydrate, (MACROBID) 100 MG capsule  2 times daily        10/26/21 2150    ibuprofen (ADVIL) 600 MG tablet  Every 8 hours PRN        10/26/21 2150    methocarbamol 1000 MG TABS  Every 8 hours PRN        10/26/21 2150              Sherwood Gambler, MD 10/26/21 2315

## 2021-10-26 NOTE — ED Triage Notes (Signed)
Pt arrived via POV, c/o left sided neck and ear pain. Also concerned for yeast infection. States on and off vaginal discharge and discomfort.

## 2021-10-26 NOTE — ED Provider Triage Note (Signed)
Emergency Medicine Provider Triage Evaluation Note  Samantha Rice , a 56 y.o. female  was evaluated in triage.   Left ear fullness for 1 week.  Says it feels like when she goes on a plane in her ears pop.  She does not remember any recent travel.  No fevers or discharge.  It does not really hurt but feels more like it is pressurized.  Left sided neck pain for about 1 month.  She does not remember any injury to exacerbate this.  It is on the left side of her neck and worse when she turns her neck.  She is having difficulty with sleeping and getting in good positions.  She has tried Tylenol and Goody's powder at home without relief.  Vaginal discharge and irritation for 1 week.  She said she first thought she had a yeast infection so she used over-the-counter Monistat which did help a little bit, but the symptoms have returned.  Review of Systems  Positive: Vaginal Discharge, neck pain, ear pain Negative:   Physical Exam  BP (!) 161/95 (BP Location: Right Arm)   Pulse 91   Temp 98.8 F (37.1 C) (Oral)   Resp 18   Ht '5\' 1"'$  (1.549 m)   Wt 87.1 kg   LMP 03/14/2014   SpO2 95%   BMI 36.28 kg/m  Gen:   Awake, no distress   Resp:  Normal effort  MSK:   Moves extremities without difficulty  Other:  No C-spine midline tenderness.  Reproducible left paraspinal muscular tenderness as well as left trapezius muscular tenderness.  Medical Decision Making  Medically screening exam initiated at 12:14 PM.  Appropriate orders placed.  Kristopher Oppenheim was informed that the remainder of the evaluation will be completed by another provider, this initial triage assessment does not replace that evaluation, and the importance of remaining in the ED until their evaluation is complete.     Adolphus Birchwood, [place] 10/26/21 1217

## 2021-10-27 LAB — GC/CHLAMYDIA PROBE AMP (~~LOC~~) NOT AT ARMC
Chlamydia: NEGATIVE
Comment: NEGATIVE
Comment: NORMAL
Neisseria Gonorrhea: NEGATIVE

## 2021-10-28 LAB — URINE CULTURE

## 2021-12-18 ENCOUNTER — Encounter (HOSPITAL_COMMUNITY): Payer: Self-pay

## 2021-12-18 ENCOUNTER — Emergency Department (HOSPITAL_COMMUNITY)
Admission: EM | Admit: 2021-12-18 | Discharge: 2021-12-18 | Disposition: A | Discharge status: Home / Self Care | Payer: Medicaid Other | Attending: Emergency Medicine | Admitting: Emergency Medicine

## 2021-12-18 ENCOUNTER — Emergency Department (HOSPITAL_COMMUNITY): Payer: Medicaid Other

## 2021-12-18 DIAGNOSIS — L720 Epidermal cyst: Secondary | ICD-10-CM

## 2021-12-18 DIAGNOSIS — Z7984 Long term (current) use of oral hypoglycemic drugs: Secondary | ICD-10-CM | POA: Insufficient documentation

## 2021-12-18 DIAGNOSIS — X58XXXA Exposure to other specified factors, initial encounter: Secondary | ICD-10-CM | POA: Insufficient documentation

## 2021-12-18 DIAGNOSIS — S161XXD Strain of muscle, fascia and tendon at neck level, subsequent encounter: Secondary | ICD-10-CM

## 2021-12-18 DIAGNOSIS — M25512 Pain in left shoulder: Secondary | ICD-10-CM | POA: Insufficient documentation

## 2021-12-18 DIAGNOSIS — S161XXA Strain of muscle, fascia and tendon at neck level, initial encounter: Secondary | ICD-10-CM | POA: Insufficient documentation

## 2021-12-18 MED ORDER — CYCLOBENZAPRINE HCL 10 MG PO TABS: 10.0000 mg | ORAL_TABLET | Freq: Three times a day (TID) | ORAL | 0 refills | Status: DC | PRN

## 2021-12-18 MED ORDER — BIOFREEZE 4 % EX GEL: 1.0000 | Freq: Four times a day (QID) | CUTANEOUS | 0 refills | Status: DC | PRN

## 2021-12-18 MED ORDER — LIDOCAINE 5 % EX PTCH: 1.0000 | MEDICATED_PATCH | CUTANEOUS | 0 refills | Status: DC

## 2021-12-18 NOTE — ED Triage Notes (Signed)
Pt presents with c/o left shoulder pain for several months as well as a knot on the left side of her neck. Pt denies any injury to those areas.

## 2021-12-18 NOTE — ED Provider Notes (Signed)
[facility] DEPT Provider Note   CSN: [id number] Arrival date & time: 12/18/21  0802     History  Chief Complaint  Patient presents with   Shoulder Pain   Knot on neck    Samantha Rice is a 56 y.o. female here for L shoulder pain and neck pain for the last few months. Reports increased tightness and soreness. Has tried tried muscle relaxers and ibuprofen without any relief.  Has not been taking it daily however.  States she has not anyone else but the ER for this pain.  Denies any trauma to the area.  No recent car accidents.  States that she woke up 1 day and she had this left shoulder pain, and neck pain, feels like the area is being stretched like a rubber band.  Denies any chest pain, shortness of breath, numbness or tingling on one side of the body.  Also complaint of a knot on the left side of her neck, has been there for the last couple days, denies any trauma to the area.  Denies any upper respiratory symptoms, urinary symptoms, weight loss, night sweats, fevers, chills.  States that area is nonpainful, just feels like a little lump.     Home Medications Prior to Admission medications   Medication Sig Start Date End Date Taking? Authorizing Provider  cyclobenzaprine (FLEXERIL) 10 MG tablet Take 1 tablet (10 mg total) by mouth 3 (three) times daily as needed for muscle spasms. 12/18/21  Yes Kalista Laguardia L, PA  lidocaine (LIDODERM) 5 % Place 1 patch onto the skin daily. Remove & Discard patch within 12 hours or as directed by MD 12/18/21  Yes Kebin Maye L, PA  Menthol, Topical Analgesic, (BIOFREEZE) 4 % GEL Apply 1 Application topically 4 (four) times daily as needed. 12/18/21  Yes Fran Neiswonger L, PA  amLODipine (NORVASC) 5 MG tablet Take 1 tablet (5 mg total) by mouth daily. 02/14/18   Fulp, Cammie, MD  fluticasone (FLONASE) 50 MCG/ACT nasal spray Place 2 sprays into both nostrils daily as needed for allergies.    [provider]   ibuprofen (ADVIL) 600 MG tablet Take 1 tablet (600 mg total) by mouth every 8 (eight) hours as needed. 10/26/21   Sherwood Gambler, MD  metFORMIN (GLUCOPHAGE) 500 MG tablet Take 1 tablet (500 mg total) by mouth 2 (two) times daily with a meal. 02/14/18   Fulp, Cammie, MD  methocarbamol 1000 MG TABS Take 1,000 mg by mouth every 8 (eight) hours as needed for muscle spasms. 10/26/21   Sherwood Gambler, MD  metroNIDAZOLE (FLAGYL) 500 MG tablet Take 1 tablet (500 mg total) by mouth 2 (two) times daily. One po bid x 7 days 10/26/21   Sherwood Gambler, MD  nitrofurantoin, macrocrystal-monohydrate, (MACROBID) 100 MG capsule Take 1 capsule (100 mg total) by mouth 2 (two) times daily. 10/26/21   Sherwood Gambler, MD  valACYclovir (VALTREX) 500 MG tablet Take 500 mg by mouth daily as needed (for outbreaks). 07/04/18   [provider]      Allergies    Naproxen    Review of Systems   Review of Systems  Musculoskeletal:  Positive for neck pain and neck stiffness.  Skin:  Negative for color change and rash.    Physical Exam Updated Vital Signs BP (!) 176/102 (BP Location: Right Arm)   Pulse 73   Temp 98 F (36.7 C) (Oral)   Resp 18   Ht '5\' 1"'$  (1.549 m)   Wt 88.5 kg  LMP 03/14/2014   SpO2 100%   BMI 36.84 kg/m  General: well-appearing Head: normocephalic Respiratory: Nonlabored respirations Cardiac: Normal rate and rhythm MSK: TTP of L trapezius and L paraspinal muscles of C-spine. ROM intact. No crepitus or bony ttp. Pain with rotation of neck. Equal strength bilaterally.  Neuro: no acute deficits. Sensation intact. CN II-VII intact. Equal strength of BUE.  Skin: Hard pea like mobile lesion on left side of neck, nonerythematous, nonfluctuant  ED Results / Procedures / Treatments   Labs (all labs ordered are listed, but only abnormal results are displayed) Labs Reviewed - No data to display  EKG None  Radiology DG Shoulder Left  Result Date: 12/18/2021 CLINICAL DATA:  Left  shoulder pain radiating to the left-sided neck from month EXAM: LEFT SHOULDER - 2+ VIEW COMPARISON:  None Available. FINDINGS: There is no evidence of fracture or dislocation. There is no evidence of arthropathy or other focal bone abnormality. Soft tissues are unremarkable. IMPRESSION: Negative. Electronically Signed   By: Jorje Guild M.D.   On: 12/18/2021 08:36    Procedures Procedures   Medications Ordered in ED Medications - No data to display  ED Course/ Medical Decision Making/ A&P                           Medical Decision Making Patient is a 56 year old female, here for left shoulder pain that is been going on for last several months, states is chronic, radiates from neck to shoulder.  She has tenderness to palpation of her trapezius, shoulder x-ray was negative for acute findings.  She denies any chest pain or shortness of breath pain is worse with rotation of her neck, and has tenderness to palpation.  Is very stiff appearing.  Symptoms are very similar to trapezius strain versus spasm.  She has no trauma, thus no C-spine necessary.  This is chronic issue.  Recommended muscle relaxers, anti-inflammatories, Tylenol, lidocaine patch as needed for pain control.  Also here for lump on left side of neck, it is pea like and hard in nature.  Nonfluctuant.  Does not appear to be like an abscess, is cystic in nature.  Discussed with patient possible cyst versus inflamed lymph node.  Discussed return precautions, importance of follow-up with PCP for further monitoring.  Discussed warm compresses for relief.  Amount and/or Complexity of Data Reviewed Radiology: ordered.    Details: Reviewed shoulder x-ray negative.  Risk OTC drugs. Prescription drug management.    Final Clinical Impression(s) / ED Diagnoses Final diagnoses:  Strain of neck muscle, subsequent encounter  Epidermal cyst of neck    Rx / DC Orders ED Discharge Orders          Ordered    cyclobenzaprine (FLEXERIL) 10  MG tablet  3 times daily PRN        12/18/21 1024    lidocaine (LIDODERM) 5 %  Every 24 hours        12/18/21 1024    Menthol, Topical Analgesic, (BIOFREEZE) 4 % GEL  4 times daily PRN        12/18/21 1024              Messi Twedt, [place], [place] 12/18/21 1325    Milton Ferguson, MD 12/19/21 1050

## 2021-12-18 NOTE — Discharge Instructions (Addendum)
Please follow-up with your primary care doctor in regards to the mass in your neck, and your neck/shoulder pain.  You may also need to see a chiropractor.  Use the Biofreeze, and lidocaine patches for pain relief, you can also take Tylenol and ibuprofen.  Do not use the muscle relaxers while driving as they may make you more sleepy.  Do not use the Flexeril with methocarbamol as they are similar in nature.  Follow-up if you feel like the mass is getting larger, you have redness, swelling to the area or severe pain.  Follow-up if you also have numbness tingling, weakness on one side of your body.

## 2022-02-21 ENCOUNTER — Ambulatory Visit: Payer: Medicaid Other | Admitting: Physician Assistant

## 2022-02-21 DIAGNOSIS — Z20822 Contact with and (suspected) exposure to covid-19: Secondary | ICD-10-CM | POA: Diagnosis not present

## 2022-02-21 MED ORDER — BENZONATATE 200 MG PO CAPS
200.0000 mg | ORAL_CAPSULE | Freq: Two times a day (BID) | ORAL | 0 refills | Status: DC | PRN
Start: 2022-02-21 — End: 2022-03-13

## 2022-02-21 MED ORDER — NIRMATRELVIR/RITONAVIR (PAXLOVID)TABLET: 3.0000 | ORAL_TABLET | Freq: Two times a day (BID) | ORAL | 0 refills | Status: AC

## 2022-02-21 NOTE — Progress Notes (Signed)
New Patient Office Visit  Subjective    Patient ID: Samantha Rice, female    DOB: Nov 21, 1965  Age: 56 y.o. MRN: [id number]  CC:  Chief Complaint  Patient presents with   Covid Exposure   Virtual Visit via Telephone Note  I connected with Kristopher Oppenheim on 02/21/22 at  3:00 PM EST by telephone and verified that I am speaking with the correct person using two identifiers.  Location: Patient: Home  Provider: [facility]    I discussed the limitations, risks, security and privacy concerns of performing an evaluation and management service by telephone and the availability of in person appointments. I also discussed with the patient that there may be a patient responsible charge related to this service. The patient expressed understanding and agreed to proceed.   History of Present Illness:   Samantha Rice reports that she started having bodyaches and headache yesterday, started having dry cough today.  States that her husband has similar symptoms, had his symptoms first, was diagnosed Sunday with COVID.  Does state that she took a home COVID test that was negative.  Eating and drinking okay  States that she has been using Robitussin without much relief.     Observations/Objective:  Medical history and current medications reviewed, no physical exam completed   Outpatient Encounter Medications as of 02/21/2022  Medication Sig   benzonatate (TESSALON) 200 MG capsule Take 1 capsule (200 mg total) by mouth 2 (two) times daily as needed for cough.   nirmatrelvir/ritonavir (PAXLOVID) 20 x 150 MG & 10 x '100MG'$  TABS Take 3 tablets by mouth 2 (two) times daily for 5 days. (Take nirmatrelvir 150 mg two tablets twice daily for 5 days and ritonavir 100 mg one tablet twice daily for 5 days) Patient GFR is >60   amLODipine (NORVASC) 5 MG tablet Take 1 tablet (5 mg total) by mouth daily.   cyclobenzaprine (FLEXERIL) 10 MG tablet Take 1 tablet (10 mg total) by mouth 3  (three) times daily as needed for muscle spasms.   fluticasone (FLONASE) 50 MCG/ACT nasal spray Place 2 sprays into both nostrils daily as needed for allergies.   ibuprofen (ADVIL) 600 MG tablet Take 1 tablet (600 mg total) by mouth every 8 (eight) hours as needed.   lidocaine (LIDODERM) 5 % Place 1 patch onto the skin daily. Remove & Discard patch within 12 hours or as directed by MD   Menthol, Topical Analgesic, (BIOFREEZE) 4 % GEL Apply 1 Application topically 4 (four) times daily as needed.   metFORMIN (GLUCOPHAGE) 500 MG tablet Take 1 tablet (500 mg total) by mouth 2 (two) times daily with a meal.   methocarbamol 1000 MG TABS Take 1,000 mg by mouth every 8 (eight) hours as needed for muscle spasms.   metroNIDAZOLE (FLAGYL) 500 MG tablet Take 1 tablet (500 mg total) by mouth 2 (two) times daily. One po bid x 7 days   nitrofurantoin, macrocrystal-monohydrate, (MACROBID) 100 MG capsule Take 1 capsule (100 mg total) by mouth 2 (two) times daily.   valACYclovir (VALTREX) 500 MG tablet Take 500 mg by mouth daily as needed (for outbreaks).   Facility-Administered Encounter Medications as of 02/21/2022  Medication   0.9 %  sodium chloride infusion    Past Medical History:  Diagnosis Date   Diabetes mellitus without complication ([place])    Hypertension     Past Surgical History:  Procedure Laterality Date   c cection      Family History  Problem Relation Age of Onset   Dementia Mother    Diabetes Father    Crohn's disease Sister    Diabetes Brother    Diabetes Paternal Grandmother    Colon cancer Neg Hx    Colon polyps Neg Hx    Esophageal cancer Neg Hx    Rectal cancer Neg Hx    Stomach cancer Neg Hx     Social History   Socioeconomic History   Marital status: Married    Spouse name: Not on file   Number of children: Not on file   Years of education: Not on file   Highest education level: Not on file  Occupational History   Not on file  Tobacco Use   Smoking status:  Every Day    Packs/day: 0.50    Types: Cigarettes   Smokeless tobacco: Never  Vaping Use   Vaping Use: Never used  Substance and Sexual Activity   Alcohol use: No   Drug use: No   Sexual activity: Yes  Other Topics Concern   Not on file  Social History Narrative   Not on file   Social Determinants of Health   Financial Resource Strain: Not on file  Food Insecurity: Not on file  Transportation Needs: Not on file  Physical Activity: Not on file  Stress: Not on file  Social Connections: Not on file  Intimate Partner Violence: Not on file    Review of Systems  Constitutional:  Negative for chills and fever.  HENT:  Negative for congestion and sore throat.   Respiratory:  Positive for cough. Negative for sputum production and shortness of breath.   Cardiovascular:  Negative for chest pain.  Gastrointestinal:  Negative for nausea and vomiting.  Genitourinary: Negative.   Musculoskeletal:  Positive for myalgias.  Skin: Negative.   Neurological:  Positive for headaches.  Psychiatric/Behavioral: Negative.          Assessment & Plan:   Problem List Items Addressed This Visit   None Visit Diagnoses     Exposure to COVID-19 virus    -  Primary   Relevant Medications   nirmatrelvir/ritonavir (PAXLOVID) 20 x 150 MG & 10 x '100MG'$  TABS   benzonatate (TESSALON) 200 MG capsule   Other Relevant Orders   COVID-19, Flu A+B and RSV      Assessment and Plan: 1. Exposure to COVID-19 virus COVID, flu, RSV testing pending.  Due to close contact and similar symptoms, will treat with Paxlovid, Tessalon Perles.  Patient education given on supportive care, red flags given for prompt reevaluation. - COVID-19, Flu A+B and RSV - nirmatrelvir/ritonavir (PAXLOVID) 20 x 150 MG & 10 x '100MG'$  TABS; Take 3 tablets by mouth 2 (two) times daily for 5 days. (Take nirmatrelvir 150 mg two tablets twice daily for 5 days and ritonavir 100 mg one tablet twice daily for 5 days) Patient GFR is >60   Dispense: 30 tablet; Refill: 0 - benzonatate (TESSALON) 200 MG capsule; Take 1 capsule (200 mg total) by mouth 2 (two) times daily as needed for cough.  Dispense: 20 capsule; Refill: 0   Follow Up Instructions:    I discussed the assessment and treatment plan with the patient. The patient was provided an opportunity to ask questions and all were answered. The patient agreed with the plan and demonstrated an understanding of the instructions.   The patient was advised to call back or seek an in-person evaluation if the symptoms worsen or if the condition fails to improve  as anticipated.  I provided 15 minutes of non-face-to-face time during this encounter.   Loraine Grip Mayers, PA-C

## 2022-02-22 ENCOUNTER — Encounter: Payer: Self-pay | Admitting: Physician Assistant

## 2022-02-22 NOTE — Patient Instructions (Signed)
You were tested for COVID, flu, and RSV.  Those results are still pending, however due to your close exposure to the COVID virus as well as current symptoms, it is reasonable for [place] to begin treatment with Paxlovid.  You will take this as directed.  Also sent a prescription for Tessalon Perles to help with your cough.  I hope that you feel better soon, please let us know if there is anything else we can do for you.  We will call you when your test results are available.  Kennieth Rad, PA-C Physician Assistant [place] [email or web address]  COVID-19 COVID-19, or coronavirus disease 2019, is an infection that is caused by a new (novel) coronavirus called SARS-CoV-2. COVID-19 can cause many symptoms. In some people, the virus may not cause any symptoms. In others, it may cause mild or severe symptoms. Some people with severe infection develop severe disease. What are the causes? This illness is caused by a virus. The virus may be in the air as tiny specks of fluid (aerosols) or droplets, or it may be on surfaces. You may catch the virus by: Breathing in droplets from an infected person. Droplets can be spread by a person breathing, speaking, singing, coughing, or sneezing. Touching something, like a table or a doorknob, that has virus on it (is contaminated) and then touching your mouth, nose, or eyes. What increases the risk? Risk for infection: You are more likely to get infected with the COVID-19 virus if: You are within 6 ft (1.8 m) of a person with COVID-19 for 15 minutes or longer. You are providing care for a person who is infected with COVID-19. You are in close personal contact with other people. Close personal contact includes hugging, kissing, or sharing eating or drinking utensils. Risk for serious illness caused by COVID-19: You are more likely to get seriously ill from the COVID-19 virus if: You have cancer. You have a  long-term (chronic) disease, such as: Chronic lung disease. This includes pulmonary embolism, chronic obstructive pulmonary disease, and cystic fibrosis. Long-term disease that lowers your body's ability to fight infection (immunocompromise). Serious cardiac conditions, such as heart failure, coronary artery disease, or cardiomyopathy. Diabetes. Chronic kidney disease. Liver diseases. These include cirrhosis, nonalcoholic fatty liver disease, alcoholic liver disease, or autoimmune hepatitis. You have obesity. You are pregnant or were recently pregnant. You have sickle cell disease. What are the signs or symptoms? Symptoms of this condition can range from mild to severe. Symptoms may appear any time from 2 to 14 days after being exposed to the virus. They include: Fever or chills. Shortness of breath or trouble breathing. Feeling tired or very tired. Headaches, body aches, or muscle aches. Runny or stuffy nose, sneezing, coughing, or sore throat. New loss of taste or smell. This is rare. Some people may also have stomach problems, such as nausea, vomiting, or diarrhea. Other people may not have any symptoms of COVID-19. How is this diagnosed? This condition may be diagnosed by testing samples to check for the COVID-19 virus. The most common tests are the PCR test and the antigen test. Tests may be done in the lab or at home. They include: Using a swab to take a sample of fluid from the back of your nose and throat (nasopharyngeal fluid), from your nose, or from your throat. Testing a sample of saliva from your mouth. Testing a sample of coughed-up mucus from your lungs (sputum). How is this treated? Treatment for COVID-19 infection depends on the  severity of the condition. Mild symptoms can be managed at home with rest, fluids, and over-the-counter medicines. Serious symptoms may be treated in a hospital intensive care unit (ICU). Treatment in the ICU may include: Supplemental oxygen.  Extra oxygen is given through a tube in the nose, a face mask, or a hood. Medicines. These may include: Antivirals, such as monoclonal antibodies. These help your body fight off certain viruses that can cause disease. Anti-inflammatories, such as corticosteroids. These reduce inflammation and suppress the immune system. Antithrombotics. These prevent or treat blood clots, if they develop. Convalescent plasma. This helps boost your immune system, if you have an underlying immunosuppressive condition or are getting immunosuppressive treatments. Prone positioning. This means you will lie on your stomach. This helps oxygen to get into your lungs. Infection control measures. If you are at risk for more serious illness caused by COVID-19, your health care provider may prescribe two long-acting monoclonal antibodies, given together every 6 months. How is this prevented? To protect yourself: Use preventive medicine (pre-exposure prophylaxis). You may get pre-exposure prophylaxis if you have moderate or severe immunocompromise. Get vaccinated. Anyone 60 months old or older who meets guidelines can get a COVID-19 vaccine or vaccine series. This includes people who are pregnant or making breast milk (lactating). Get an added dose of COVID-19 vaccine after your first vaccine or vaccine series if you have moderate to severe immunocompromise. This applies if you have had a solid organ transplant or have been diagnosed with an immunocompromising condition. You should get the added dose 4 weeks after you got the first COVID-19 vaccine or vaccine series. If you get an mRNA vaccine, you will need a 3-dose primary series. If you get the J&J/Janssen vaccine, you will need a 2-dose primary series, with the second dose being an mRNA vaccine. Talk to your health care provider about getting experimental monoclonal antibodies. This treatment is approved under emergency use authorization to prevent severe illness before or  after being exposed to the COVID-19 virus. You may be given monoclonal antibodies if: You have moderate or severe immunocompromise. This includes treatments that lower your immune response. People with immunocompromise may not develop protection against COVID-19 when they are vaccinated. You cannot be vaccinated. You may not get a vaccine if you have a severe allergic reaction to the vaccine or its components. You are not fully vaccinated. You are in a facility where COVID-19 is present and: Are in close contact with a person who is infected with the COVID-19 virus. Are at high risk of being exposed to the COVID-19 virus. You are at risk of illness from new variants of the COVID-19 virus. To protect others: If you have symptoms of COVID-19, take steps to prevent the virus from spreading to others. Stay home. Leave your house only to get medical care. Do not use public transit, if possible. Do not travel while you are sick. Wash your hands often with soap and water for at least 20 seconds. If soap and water are not available, use alcohol-based hand sanitizer. Make sure that all people in your household wash their hands well and often. Cough or sneeze into a tissue or your sleeve or elbow. Do not cough or sneeze into your hand or into the air. Where to find more information Centers for Disease Control and Prevention: [employer] World Health Organization: https://www.castaneda.info/ Get help right away if: You have trouble breathing. You have pain or pressure in your chest. You are confused. You have bluish lips and fingernails. You  have trouble waking from sleep. You have symptoms that get worse. These symptoms may be an emergency. Get help right away. Call 911. Do not wait to see if the symptoms will go away. Do not drive yourself to the hospital. Summary COVID-19 is an infection that is caused by a new coronavirus. Sometimes, there are no symptoms. Other times,  symptoms range from mild to severe. Some people with a severe COVID-19 infection develop severe disease. The virus that causes COVID-19 can spread from person to person through droplets or aerosols from breathing, speaking, singing, coughing, or sneezing. Mild symptoms of COVID-19 can be managed at home with rest, fluids, and over-the-counter medicines. This information is not intended to replace advice given to you by your health care provider. Make sure you discuss any questions you have with your health care provider. Document Revised: 02/01/2021 Document Reviewed: 02/03/2021 Elsevier Patient Education  [place].

## 2022-02-23 LAB — COVID-19, FLU A+B AND RSV
Influenza A, NAA: NOT DETECTED
Influenza B, NAA: NOT DETECTED
RSV, NAA: NOT DETECTED
SARS-CoV-2, NAA: DETECTED — AB

## 2022-03-09 DIAGNOSIS — Z1231 Encounter for screening mammogram for malignant neoplasm of breast: Secondary | ICD-10-CM | POA: Diagnosis not present

## 2022-03-13 ENCOUNTER — Ambulatory Visit (INDEPENDENT_AMBULATORY_CARE_PROVIDER_SITE_OTHER): Payer: Medicaid Other | Admitting: Primary Care

## 2022-03-13 ENCOUNTER — Encounter (INDEPENDENT_AMBULATORY_CARE_PROVIDER_SITE_OTHER): Payer: Self-pay | Admitting: Primary Care

## 2022-03-13 VITALS — BP 160/92 | HR 93 | Resp 16 | Ht 61.0 in | Wt 195.6 lb

## 2022-03-13 DIAGNOSIS — M24812 Other specific joint derangements of left shoulder, not elsewhere classified: Secondary | ICD-10-CM

## 2022-03-13 DIAGNOSIS — Z6836 Body mass index (BMI) 36.0-36.9, adult: Secondary | ICD-10-CM | POA: Diagnosis not present

## 2022-03-13 DIAGNOSIS — E049 Nontoxic goiter, unspecified: Secondary | ICD-10-CM

## 2022-03-13 DIAGNOSIS — Z114 Encounter for screening for human immunodeficiency virus [HIV]: Secondary | ICD-10-CM

## 2022-03-13 DIAGNOSIS — F17210 Nicotine dependence, cigarettes, uncomplicated: Secondary | ICD-10-CM | POA: Diagnosis not present

## 2022-03-13 DIAGNOSIS — I10 Essential (primary) hypertension: Secondary | ICD-10-CM | POA: Diagnosis not present

## 2022-03-13 DIAGNOSIS — E6609 Other obesity due to excess calories: Secondary | ICD-10-CM

## 2022-03-13 DIAGNOSIS — Z1159 Encounter for screening for other viral diseases: Secondary | ICD-10-CM

## 2022-03-13 DIAGNOSIS — Z7689 Persons encountering health services in other specified circumstances: Secondary | ICD-10-CM

## 2022-03-13 DIAGNOSIS — R7309 Other abnormal glucose: Secondary | ICD-10-CM | POA: Diagnosis not present

## 2022-03-13 MED ORDER — VALSARTAN-HYDROCHLOROTHIAZIDE 80-12.5 MG PO TABS: 1.0000 | ORAL_TABLET | Freq: Every day | ORAL | 3 refills | Status: DC

## 2022-03-13 NOTE — Patient Instructions (Signed)
Crepitus, sometimes called crepitation (krep-i-tay-shen), describes any grinding, creaking, cracking, grating, crunching, or popping that occurs when moving a joint. People can experience crepitus at any age, but it becomes more common as people get older.

## 2022-03-13 NOTE — Progress Notes (Signed)
New Patient Office Visit  Subjective    Patient ID: Samantha Rice, female    DOB: Nov 08, 1965  Age: 57 y.o. MRN: [id number]  CC:  Chief Complaint  Patient presents with   New Patient (Initial Visit)    HPI Ms. Samantha Rice is a 57 year old obese female presents to establish care. She is concerned about her elevated Bp and out of medications for several weeks. She has notice more frequent headache. Patient has, No chest pain, No abdominal pain - No Nausea, No new weakness tingling or numbness, No Cough - shortness of breath .   Outpatient Encounter Medications as of 03/13/2022  Medication Sig   amLODipine (NORVASC) 5 MG tablet Take 1 tablet (5 mg total) by mouth daily.   fluticasone (FLONASE) 50 MCG/ACT nasal spray Place 2 sprays into both nostrils daily as needed for allergies.   ibuprofen (ADVIL) 600 MG tablet Take 1 tablet (600 mg total) by mouth every 8 (eight) hours as needed.   metFORMIN (GLUCOPHAGE) 500 MG tablet Take 1 tablet (500 mg total) by mouth 2 (two) times daily with a meal.   valACYclovir (VALTREX) 500 MG tablet Take 500 mg by mouth daily as needed (for outbreaks).   benzonatate (TESSALON) 200 MG capsule Take 1 capsule (200 mg total) by mouth 2 (two) times daily as needed for cough. (Patient not taking: Reported on 03/13/2022)   cyclobenzaprine (FLEXERIL) 10 MG tablet Take 1 tablet (10 mg total) by mouth 3 (three) times daily as needed for muscle spasms. (Patient not taking: Reported on 03/13/2022)   lidocaine (LIDODERM) 5 % Place 1 patch onto the skin daily. Remove & Discard patch within 12 hours or as directed by MD (Patient not taking: Reported on 03/13/2022)   Menthol, Topical Analgesic, (BIOFREEZE) 4 % GEL Apply 1 Application topically 4 (four) times daily as needed. (Patient not taking: Reported on 03/13/2022)   methocarbamol 1000 MG TABS Take 1,000 mg by mouth every 8 (eight) hours as needed for muscle spasms. (Patient not taking: Reported on 03/13/2022)    metroNIDAZOLE (FLAGYL) 500 MG tablet Take 1 tablet (500 mg total) by mouth 2 (two) times daily. One po bid x 7 days (Patient not taking: Reported on 03/13/2022)   nitrofurantoin, macrocrystal-monohydrate, (MACROBID) 100 MG capsule Take 1 capsule (100 mg total) by mouth 2 (two) times daily. (Patient not taking: Reported on 03/13/2022)   Facility-Administered Encounter Medications as of 03/13/2022  Medication   0.9 %  sodium chloride infusion    Past Medical History:  Diagnosis Date   Diabetes mellitus without complication ([place])    Hypertension     Past Surgical History:  Procedure Laterality Date   c cection      Family History  Problem Relation Age of Onset   Dementia Mother    Diabetes Father    Crohn's disease Sister    Diabetes Brother    Diabetes Paternal Grandmother    Colon cancer Neg Hx    Colon polyps Neg Hx    Esophageal cancer Neg Hx    Rectal cancer Neg Hx    Stomach cancer Neg Hx     Social History   Socioeconomic History   Marital status: Married    Spouse name: Not on file   Number of children: Not on file   Years of education: Not on file   Highest education level: Not on file  Occupational History   Not on file  Tobacco Use   Smoking status: Every Day  Packs/day: 0.50    Types: Cigarettes   Smokeless tobacco: Never  Vaping Use   Vaping Use: Never used  Substance and Sexual Activity   Alcohol use: No   Drug use: No   Sexual activity: Yes  Other Topics Concern   Not on file  Social History Narrative   Not on file   Social Determinants of Health   Financial Resource Strain: Not on file  Food Insecurity: Not on file  Transportation Needs: Not on file  Physical Activity: Not on file  Stress: Not on file  Social Connections: Not on file  Intimate Partner Violence: Not on file    ROS Comprehensive ROS Pertinent positive and negative noted in HPI       Objective    Blood Pressure (Abnormal) 162/89   Pulse 93   Respiration 16    Height '5\' 1"'$  (1.549 m)   Weight 195 lb 9.6 oz (88.7 kg)   Last Menstrual Period 03/14/2014   Oxygen Saturation 100%   Body Mass Index 36.96 kg/m   Physical exam: General: Vital signs reviewed.  Patient is well-developed and well-nourished, obese female in no acute distress and cooperative with exam. Head: Normocephalic and atraumatic. Eyes: EOMI, conjunctivae normal, no scleral icterus. Neck: Supple, trachea midline, normal ROM, no JVD, masses, thyromegaly, or carotid bruit present. Cardiovascular: RRR, S1 normal, S2 normal, no murmurs, gallops, or rubs. Pulmonary/Chest: Clear to auscultation bilaterally, no wheezes, rales, or rhonchi. Abdominal: Soft, non-tender, non-distended, BS +, no masses, organomegaly, or guarding present. Musculoskeletal: No joint deformities, erythema, or stiffness, ROM full and nontender. Extremities: No lower extremity edema bilaterally,  pulses symmetric and intact bilaterally. No cyanosis or clubbing. Neurological: A&O x3, Strength is normal Skin: Warm, dry and intact. No rashes or erythema. Psychiatric: Normal mood and affect. speech and behavior is normal. Cognition and memory are normal.       Assessment & Plan:  Tramya was seen today for new patient (initial visit).  Diagnoses and all orders for this visit:  Encounter to establish care  Essential hypertension BP goal - < 130/80 Explained that having normal blood pressure is the goal and medications are helping to get to goal and maintain normal blood pressure. DIET: Limit salt intake, read nutrition labels to check salt content, limit fried and high fatty foods  Avoid using multisymptom OTC cold preparations that generally contain sudafed which can rise BP. Consult with pharmacist on best cold relief products to use for persons with HTN EXERCISE Discussed incorporating exercise such as walking - 30 minutes most days of the week and can do in 10 minute intervals    -     CMP14+EGFR;  Future  Thyroid enlarged -     TSH + free T4; Future  Class 2 obesity due to excess calories without serious comorbidity with body mass index (BMI) of 36.0 to 36.9 in adult Obesity is 30-39 indicating an excess in caloric intake or underlining conditions. This may lead to other co-morbidities. Educated on lifestyle modifications of diet and exercise which may reduce obesity.   -     Lipid panel  Elevated glucose level -     Hemoglobin A1c; Future -     CBC with Differential; Future  Encounter for HCV screening test for low risk patient -     HCV Ab w Reflex to Quant PCR; Future  Screening for HIV (human immunodeficiency virus) -     HIV Antibody (routine testing w rflx); Future   Crepitus of  left shoulder joint -     Ambulatory referral to Orthopedic Surgery    Return in about 6 weeks (around 04/24/2022) for pap.   Kerin Perna, NP

## 2022-03-14 ENCOUNTER — Ambulatory Visit (INDEPENDENT_AMBULATORY_CARE_PROVIDER_SITE_OTHER): Payer: Medicaid Other

## 2022-03-15 ENCOUNTER — Ambulatory Visit (INDEPENDENT_AMBULATORY_CARE_PROVIDER_SITE_OTHER): Payer: Medicaid Other

## 2022-03-15 DIAGNOSIS — Z114 Encounter for screening for human immunodeficiency virus [HIV]: Secondary | ICD-10-CM | POA: Diagnosis not present

## 2022-03-15 DIAGNOSIS — R7309 Other abnormal glucose: Secondary | ICD-10-CM | POA: Diagnosis not present

## 2022-03-15 DIAGNOSIS — Z1159 Encounter for screening for other viral diseases: Secondary | ICD-10-CM

## 2022-03-15 DIAGNOSIS — I10 Essential (primary) hypertension: Secondary | ICD-10-CM

## 2022-03-15 DIAGNOSIS — E049 Nontoxic goiter, unspecified: Secondary | ICD-10-CM | POA: Diagnosis not present

## 2022-03-16 LAB — CMP14+EGFR
ALT: 13 IU/L (ref 0–32)
AST: 12 IU/L (ref 0–40)
Albumin/Globulin Ratio: 1.6 (ref 1.2–2.2)
Albumin: 4.3 g/dL (ref 3.8–4.9)
Alkaline Phosphatase: 99 IU/L (ref 44–121)
BUN/Creatinine Ratio: 16 (ref 9–23)
BUN: 9 mg/dL (ref 6–24)
Bilirubin Total: 0.5 mg/dL (ref 0.0–1.2)
CO2: 26 mmol/L (ref 20–29)
Calcium: 9.8 mg/dL (ref 8.7–10.2)
Chloride: 102 mmol/L (ref 96–106)
Creatinine, Ser: 0.57 mg/dL (ref 0.57–1.00)
Globulin, Total: 2.7 g/dL (ref 1.5–4.5)
Glucose: 214 mg/dL — ABNORMAL HIGH (ref 70–99)
Potassium: 4.5 mmol/L (ref 3.5–5.2)
Sodium: 141 mmol/L (ref 134–144)
Total Protein: 7 g/dL (ref 6.0–8.5)
eGFR: 108 mL/min/{1.73_m2} (ref >59)

## 2022-03-16 LAB — CBC WITH DIFFERENTIAL/PLATELET
Basophils Absolute: 0 10*3/uL (ref 0.0–0.2)
Basos: 1 %
EOS (ABSOLUTE): 0.2 10*3/uL (ref 0.0–0.4)
Eos: 2 %
Hematocrit: 42.8 % (ref 34.0–46.6)
Hemoglobin: 14 g/dL (ref 11.1–15.9)
Immature Grans (Abs): 0 10*3/uL (ref 0.0–0.1)
Immature Granulocytes: 0 %
Lymphocytes Absolute: 2.5 10*3/uL (ref 0.7–3.1)
Lymphs: 34 %
MCH: 28.4 pg (ref 26.6–33.0)
MCHC: 32.7 g/dL (ref 31.5–35.7)
MCV: 87 fL (ref 79–97)
Monocytes Absolute: 0.4 10*3/uL (ref 0.1–0.9)
Monocytes: 5 %
Neutrophils Absolute: 4.1 10*3/uL (ref 1.4–7.0)
Neutrophils: 58 %
Platelets: 204 10*3/uL (ref 150–450)
RBC: 4.93 x10E6/uL (ref 3.77–5.28)
RDW: 12.2 % (ref 11.7–15.4)
WBC: 7.2 10*3/uL (ref 3.4–10.8)

## 2022-03-16 LAB — TSH+FREE T4
Free T4: 1.05 ng/dL (ref 0.82–1.77)
TSH: 1.84 u[IU]/mL (ref 0.450–4.500)

## 2022-03-16 LAB — HIV ANTIBODY (ROUTINE TESTING W REFLEX): HIV Screen 4th Generation wRfx: NONREACTIVE

## 2022-03-16 LAB — HEMOGLOBIN A1C
Est. average glucose Bld gHb Est-mCnc: 226 mg/dL
Hgb A1c MFr Bld: 9.5 % — ABNORMAL HIGH (ref 4.8–5.6)

## 2022-03-16 LAB — HCV INTERPRETATION

## 2022-03-16 LAB — HCV AB W REFLEX TO QUANT PCR: HCV Ab: NONREACTIVE

## 2022-03-20 ENCOUNTER — Other Ambulatory Visit (INDEPENDENT_AMBULATORY_CARE_PROVIDER_SITE_OTHER): Payer: Self-pay | Admitting: Primary Care

## 2022-03-20 DIAGNOSIS — E1110 Type 2 diabetes mellitus with ketoacidosis without coma: Secondary | ICD-10-CM

## 2022-03-20 MED ORDER — GLIPIZIDE 10 MG PO TABS: 10.0000 mg | ORAL_TABLET | Freq: Two times a day (BID) | ORAL | 1 refills | Status: DC

## 2022-03-20 MED ORDER — EMPAGLIFLOZIN 10 MG PO TABS: 10.0000 mg | ORAL_TABLET | Freq: Every day | ORAL | 1 refills | Status: DC

## 2022-03-20 MED ORDER — METFORMIN HCL 1000 MG PO TABS: 1000.0000 mg | ORAL_TABLET | Freq: Two times a day (BID) | ORAL | 3 refills | Status: DC

## 2022-03-23 ENCOUNTER — Other Ambulatory Visit: Payer: Self-pay

## 2022-03-23 ENCOUNTER — Telehealth: Payer: Self-pay

## 2022-03-23 NOTE — Telephone Encounter (Signed)
Jardiance prior authorization request has been approved:

## 2022-03-29 ENCOUNTER — Encounter: Payer: Self-pay | Admitting: Orthopedic Surgery

## 2022-03-29 ENCOUNTER — Ambulatory Visit (INDEPENDENT_AMBULATORY_CARE_PROVIDER_SITE_OTHER): Payer: Medicaid Other | Admitting: Orthopedic Surgery

## 2022-03-29 DIAGNOSIS — M25512 Pain in left shoulder: Secondary | ICD-10-CM

## 2022-03-29 MED ORDER — TRAMADOL HCL 50 MG PO TABS: ORAL_TABLET | ORAL | 0 refills | Status: DC

## 2022-03-29 NOTE — Progress Notes (Signed)
Office Visit Note   Patient: Samantha Rice           Date of Birth: 06-01-1965           MRN: [id number] Visit Date: 03/29/2022 Requested by: Kerin Perna, NP [place],  [place] [address] PCP: Bartholome Bill, MD  Subjective: Chief Complaint  Patient presents with   Left Shoulder - Pain    HPI: Samantha Rice is a 57 y.o. female who presents to the office reporting left scapular pain.  Pain has been going on for 6 months.  Denies any history of injury.  She is right-hand dominant.  Describes decreased range of motion overhead due to her pain.  The pain wakes her from sleep at night on most nights.  She reports no radicular pain down the arm but does report some neck pain occasionally.  No numbness and tingling in the arms.  She does report some occasional crepitus interscapular region.  Tried Goody powder without relief.  Went to the emergency department and muscle relaxer also did not give her relief.  She works as a Forensic psychologist.  She is able to do "house things" with her shoulder.  The pain is not really activity related..                ROS: All systems reviewed are negative as they relate to the chief complaint within the history of present illness.  Patient denies fevers or chills.  Assessment & Plan: Visit Diagnoses:  1. Left shoulder pain, unspecified chronicity     Plan: Impression is left scapular pain.  Radiographs unremarkable.  Symptoms ongoing for 6 months with night pain and focal tenderness along the medial border of the scapula.  She has not been able to do any type of exercise program because of her symptoms.  This is an atypical pain pattern which has persisted for 6 months which has been refractory to nonoperative management.  Recommend MRI scanning of the scapula with consideration that this could be coming as referred pain from the neck.  Again with no real arm symptoms that is a little less likely but still should be  considered.  Alternatively this could be muscle tearing and pulling from the left scapula which could be symptomatic.  Will see what the scan shows follow-up after that study.  Recommend topical Voltaren as a temporizing measure until we get the study on the scapula.  Follow-Up Instructions: No follow-ups on file.   Orders:  Orders Placed This Encounter  Procedures   MR Shoulder Left w/o contrast   Meds ordered this encounter  Medications   traMADol (ULTRAM) 50 MG tablet    Sig: 1 po q hs prn pain    Dispense:  25 tablet    Refill:  0      Procedures: No procedures performed   Clinical Data: No additional findings.  Objective: Vital Signs: LMP 03/14/2014   Physical Exam:  Constitutional: Patient appears well-developed HEENT:  Head: Normocephalic Eyes:EOM are normal Neck: Normal range of motion Cardiovascular: Normal rate Pulmonary/chest: Effort normal Neurologic: Patient is alert Skin: Skin is warm Psychiatric: Patient has normal mood and affect  Ortho Exam: Ortho exam demonstrates good cervical spine range of motion.  5 out of 5 grip EPL FPL interosseous wrist flexion extension bicep tricep deltoid strength with no scapular dyskinesia with forward flexion or abduction.  I do not detect any scapulothoracic crepitus.  She does have focal tenderness on  the medial border of the scapula in the midportion.  Rotator cuff strength intact bilaterally.  Both shoulders have range of motion of 65/100/175.  No coarse grinding on the left with internal/external rotation of the arm at 90 degrees of abduction and no discrete AC joint tenderness.  Specialty Comments:  No specialty comments available.  Imaging: No results found.   PMFS History: Patient Active Problem List   Diagnosis Date Noted   Preventative health care 10/17/2010   Past Medical History:  Diagnosis Date   Diabetes mellitus without complication ([place])    Hypertension     Family History  Problem Relation Age  of Onset   Dementia Mother    Diabetes Father    Crohn's disease Sister    Diabetes Brother    Diabetes Paternal Grandmother    Colon cancer Neg Hx    Colon polyps Neg Hx    Esophageal cancer Neg Hx    Rectal cancer Neg Hx    Stomach cancer Neg Hx     Past Surgical History:  Procedure Laterality Date   c cection     Social History   Occupational History   Not on file  Tobacco Use   Smoking status: Every Day    Packs/day: 0.50    Types: Cigarettes   Smokeless tobacco: Never  Vaping Use   Vaping Use: Never used  Substance and Sexual Activity   Alcohol use: No   Drug use: No   Sexual activity: Yes

## 2022-04-03 ENCOUNTER — Encounter (INDEPENDENT_AMBULATORY_CARE_PROVIDER_SITE_OTHER): Payer: Self-pay

## 2022-04-03 ENCOUNTER — Ambulatory Visit (INDEPENDENT_AMBULATORY_CARE_PROVIDER_SITE_OTHER): Payer: Medicaid Other | Admitting: Primary Care

## 2022-04-03 ENCOUNTER — Telehealth (INDEPENDENT_AMBULATORY_CARE_PROVIDER_SITE_OTHER): Payer: Self-pay

## 2022-04-03 NOTE — Telephone Encounter (Signed)
Pt came into the office today for an appt for fasting blood work. Pt had blood work done on 03/15/2022. Pt states she was here for her pap. Made pt aware that her pap is scheduled for 04/24/22 at 910am. Pt states oh and started laughing...  Pt wanted me to share a message to the provider in regards to the Glipizide. Pt states she feels that she doesn't need the Glipizide and she has not been taking it. Pt states she is doing okay with just the Metformin and Jardiance. Pt states she checks her sugars 3 times a day because she purchased a glucose machine the same day she seen provider. and her sugars are ranging between 100-150. Pt states her sugars haven't been over 200. Pt states her brother use to take Glipizide and he had a allergic reaction to it.   Will forward to provider as a [place]

## 2022-04-04 ENCOUNTER — Other Ambulatory Visit (INDEPENDENT_AMBULATORY_CARE_PROVIDER_SITE_OTHER): Payer: Self-pay | Admitting: Primary Care

## 2022-04-04 ENCOUNTER — Emergency Department (HOSPITAL_COMMUNITY)
Admission: EM | Admit: 2022-04-04 | Discharge: 2022-04-04 | Disposition: A | Discharge status: Home / Self Care | Payer: Medicaid Other | Attending: Emergency Medicine | Admitting: Emergency Medicine

## 2022-04-04 DIAGNOSIS — M5412 Radiculopathy, cervical region: Secondary | ICD-10-CM | POA: Insufficient documentation

## 2022-04-04 DIAGNOSIS — M25512 Pain in left shoulder: Secondary | ICD-10-CM | POA: Diagnosis present

## 2022-04-04 NOTE — ED Provider Notes (Signed)
[facility] Provider Note   CSN: [id number] Arrival date & time: 04/04/22  0406     History  Chief Complaint  Patient presents with   Shoulder Pain    Samantha Rice is a 57 y.o. female.  57 year old female presents with complaint of pain in her right arm, intermittent for the past 2 days, described as shooting to the 3rd finger when she has pain, not having any pain at this time. Not reproduced with activity, ROM, denies weakness or numbness. Right hand dominant. Reports having pain in her left shoulder for several months, recently seen by ortho (Dr. Marlou Sa) with plan for MRI of the left shoulder.        Home Medications Prior to Admission medications   Medication Sig Start Date End Date Taking? Authorizing Provider  empagliflozin (JARDIANCE) 10 MG TABS tablet Take 1 tablet (10 mg total) by mouth daily before breakfast. 03/20/22   Kerin Perna, NP  fluticasone (FLONASE) 50 MCG/ACT nasal spray Place 2 sprays into both nostrils daily as needed for allergies.    [provider]  glipiZIDE (GLUCOTROL) 10 MG tablet Take 1 tablet (10 mg total) by mouth 2 (two) times daily before a meal. 03/20/22   Kerin Perna, NP  ibuprofen (ADVIL) 600 MG tablet Take 1 tablet (600 mg total) by mouth every 8 (eight) hours as needed. 10/26/21   Sherwood Gambler, MD  metFORMIN (GLUCOPHAGE) 1000 MG tablet Take 1 tablet (1,000 mg total) by mouth 2 (two) times daily with a meal. 03/20/22   Kerin Perna, NP  traMADol Veatrice Bourbon) 50 MG tablet 1 po q hs prn pain 03/29/22   Meredith Pel, MD  valACYclovir (VALTREX) 500 MG tablet Take 500 mg by mouth daily as needed (for outbreaks). 07/04/18   [provider]  valsartan-hydrochlorothiazide (DIOVAN-HCT) 80-12.5 MG tablet Take 1 tablet by mouth daily. 03/13/22   Kerin Perna, NP      Allergies    Naproxen    Review of Systems   Review of Systems Negative except as per  HPI Physical Exam Updated Vital Signs BP (!) 158/86 (BP Location: Right Arm)   Pulse 89   Temp 98.1 F (36.7 C) (Oral)   Resp 18   Ht '5\' 1"'$  (1.549 m)   Wt 88.5 kg   LMP 03/14/2014   SpO2 100%   BMI 36.84 kg/m  Physical Exam Vitals and nursing note reviewed.  Constitutional:      General: She is not in acute distress.    Appearance: She is well-developed. She is not diaphoretic.  HENT:     Head: Normocephalic and atraumatic.  Cardiovascular:     Pulses: Normal pulses.  Pulmonary:     Effort: Pulmonary effort is normal.  Musculoskeletal:        General: Tenderness present. No swelling or deformity. Normal range of motion.       Back:  Skin:    General: Skin is warm and dry.     Findings: No erythema or rash.  Neurological:     Mental Status: She is alert and oriented to person, place, and time.     Sensory: No sensory deficit.     Motor: No weakness.  Psychiatric:        Behavior: Behavior normal.     ED Results / Procedures / Treatments   Labs (all labs ordered are listed, but only abnormal results are displayed) Labs Reviewed - No data  to display  EKG None  Radiology No results found.  Procedures Procedures    Medications Ordered in ED Medications - No data to display  ED Course/ Medical Decision Making/ A&P                             Medical Decision Making  57 yo female with right arm pain as above. Found to have tenderness along the right and left trapezius area extending along medial borders of bilateral scapula. Equal grip strength, sensation intact. ROM right shoulder, elbow, wrist normal. Radial pulse present, cap refill brisk. Suspect cervical radiculopathy, recommend follow up with her orthopedist.  Patient is taking tramadol for pain which she feels does not help only makes her sleepy.  Offered to change medications, patient declines.        Final Clinical Impression(s) / ED Diagnoses Final diagnoses:  Cervical radiculopathy     Rx / DC Orders ED Discharge Orders     None         Tacy Learn, PA-C 04/04/22 0510    Merryl Hacker, MD 04/04/22 2326

## 2022-04-04 NOTE — ED Triage Notes (Signed)
Patient arrived with complaints of right shoulder pain over the last few days. Declines any injury.  States she has trouble with her left shoulder chronically and unsure if this is related. Last dose of Tylenol around 8pm.

## 2022-04-04 NOTE — Telephone Encounter (Signed)
Noted  

## 2022-04-10 ENCOUNTER — Ambulatory Visit
Admission: RE | Admit: 2022-04-10 | Discharge: 2022-04-10 | Disposition: A | Discharge status: Home / Self Care | Payer: Medicaid Other | Source: Ambulatory Visit | Attending: Orthopedic Surgery | Admitting: Orthopedic Surgery

## 2022-04-10 DIAGNOSIS — M25512 Pain in left shoulder: Secondary | ICD-10-CM

## 2022-04-10 DIAGNOSIS — M19012 Primary osteoarthritis, left shoulder: Secondary | ICD-10-CM | POA: Diagnosis not present

## 2022-04-19 ENCOUNTER — Encounter: Payer: Self-pay | Admitting: Orthopedic Surgery

## 2022-04-19 ENCOUNTER — Ambulatory Visit (INDEPENDENT_AMBULATORY_CARE_PROVIDER_SITE_OTHER): Payer: Medicaid Other

## 2022-04-19 ENCOUNTER — Ambulatory Visit (INDEPENDENT_AMBULATORY_CARE_PROVIDER_SITE_OTHER): Payer: Medicaid Other | Admitting: Orthopedic Surgery

## 2022-04-19 DIAGNOSIS — M5412 Radiculopathy, cervical region: Secondary | ICD-10-CM | POA: Diagnosis not present

## 2022-04-19 NOTE — Progress Notes (Signed)
Office Visit Note   Patient: Samantha Rice           Date of Birth: 26-Sep-1965           MRN: [id number] Visit Date: 04/19/2022 Requested by: Bartholome Bill, [place] [place],  [place] [address] PCP: Kerin Perna, NP  Subjective: Chief Complaint  Patient presents with   Other    Scan review    HPI: Samantha Rice is a 57 y.o. female who presents to the office reporting shoulder pain as well as new neck pain.  She reports sharp pain in the neck which radiates down the right arm to her fingers.  Tramadol is used at night for pain.  Symptoms ongoing for 6 months.  She is failed conservative treatment measures.  She has had an MRI scan of her shoulder which is reviewed and which is really unremarkable for any significant pathology in the shoulder to explain her symptoms..                ROS: All systems reviewed are negative as they relate to the chief complaint within the history of present illness.  Patient denies fevers or chills.  Assessment & Plan: Visit Diagnoses:  1. Radiculopathy, cervical region     Plan: Impression is normal MRI scan on the left shoulder.  Currently she is having right-sided radicular symptoms ongoing for many months.  Cervical spine radiographs are fairly clean looking in terms of absence of significant degenerative changes.  Need MRI cervical spine to evaluate left and right-sided radiculopathy.  Follow-up after that study.  Follow-Up Instructions: No follow-ups on file.   Orders:  Orders Placed This Encounter  Procedures   XR Cervical Spine 2 or 3 views   MR Cervical Spine w/o contrast   No orders of the defined types were placed in this encounter.     Procedures: No procedures performed   Clinical Data: No additional findings.  Objective: Vital Signs: LMP 03/14/2014   Physical Exam:  Constitutional: Patient appears well-developed HEENT:  Head: Normocephalic Eyes:EOM are normal Neck: Normal range  of motion Cardiovascular: Normal rate Pulmonary/chest: Effort normal Neurologic: Patient is alert Skin: Skin is warm Psychiatric: Patient has normal mood and affect  Ortho Exam: Ortho exam demonstrates no loss of symmetric external rotation bilaterally.  Rotator cuff strength intact infraspinatus retroseptal muscle testing.  No discrete AC joint tenderness is present.  Motor or sensory function in both arms intact.  Cervical spine range of motion demonstrates pain with rotation to the right and left.  No scapular dyskinesia with forward flexion.  Reflexes symmetric 0 to 1+ out of 4 bilateral biceps and triceps.  Specialty Comments:  No specialty comments available.  Imaging: No results found.   PMFS History: Patient Active Problem List   Diagnosis Date Noted   Preventative health care 10/17/2010   Past Medical History:  Diagnosis Date   Diabetes mellitus without complication ([place])    Hypertension     Family History  Problem Relation Age of Onset   Dementia Mother    Diabetes Father    Crohn's disease Sister    Diabetes Brother    Diabetes Paternal Grandmother    Colon cancer Neg Hx    Colon polyps Neg Hx    Esophageal cancer Neg Hx    Rectal cancer Neg Hx    Stomach cancer Neg Hx     Past Surgical History:  Procedure Laterality Date   c cection  Social History   Occupational History   Not on file  Tobacco Use   Smoking status: Every Day    Packs/day: 0.50    Types: Cigarettes   Smokeless tobacco: Never  Vaping Use   Vaping Use: Never used  Substance and Sexual Activity   Alcohol use: No   Drug use: No   Sexual activity: Yes

## 2022-04-24 ENCOUNTER — Ambulatory Visit (INDEPENDENT_AMBULATORY_CARE_PROVIDER_SITE_OTHER): Payer: Self-pay

## 2022-04-24 ENCOUNTER — Ambulatory Visit (INDEPENDENT_AMBULATORY_CARE_PROVIDER_SITE_OTHER): Payer: Medicaid Other | Admitting: Primary Care

## 2022-04-24 ENCOUNTER — Ambulatory Visit (INDEPENDENT_AMBULATORY_CARE_PROVIDER_SITE_OTHER): Payer: Self-pay | Admitting: Primary Care

## 2022-05-01 ENCOUNTER — Ambulatory Visit
Admission: RE | Admit: 2022-05-01 | Discharge: 2022-05-01 | Disposition: A | Discharge status: Home / Self Care | Payer: Medicaid Other | Source: Ambulatory Visit | Attending: Orthopedic Surgery | Admitting: Orthopedic Surgery

## 2022-05-01 DIAGNOSIS — M5412 Radiculopathy, cervical region: Secondary | ICD-10-CM

## 2022-06-21 ENCOUNTER — Encounter (INDEPENDENT_AMBULATORY_CARE_PROVIDER_SITE_OTHER): Payer: Self-pay | Admitting: Primary Care

## 2022-06-21 DIAGNOSIS — I10 Essential (primary) hypertension: Secondary | ICD-10-CM

## 2022-06-21 DIAGNOSIS — E1110 Type 2 diabetes mellitus with ketoacidosis without coma: Secondary | ICD-10-CM

## 2022-06-26 MED ORDER — VALSARTAN-HYDROCHLOROTHIAZIDE 80-12.5 MG PO TABS: 1.0000 | ORAL_TABLET | Freq: Every day | ORAL | 0 refills | Status: DC

## 2022-06-26 MED ORDER — VALACYCLOVIR HCL 500 MG PO TABS: 500.0000 mg | ORAL_TABLET | Freq: Every day | ORAL | 0 refills | Status: DC | PRN

## 2022-06-26 MED ORDER — METFORMIN HCL 1000 MG PO TABS: 1000.0000 mg | ORAL_TABLET | Freq: Two times a day (BID) | ORAL | 0 refills | Status: DC

## 2022-06-26 MED ORDER — EMPAGLIFLOZIN 10 MG PO TABS: 10.0000 mg | ORAL_TABLET | Freq: Every day | ORAL | 0 refills | Status: DC

## 2022-07-13 ENCOUNTER — Ambulatory Visit (INDEPENDENT_AMBULATORY_CARE_PROVIDER_SITE_OTHER): Payer: Medicaid Other | Admitting: Primary Care

## 2022-07-13 ENCOUNTER — Encounter (INDEPENDENT_AMBULATORY_CARE_PROVIDER_SITE_OTHER): Payer: Self-pay | Admitting: Primary Care

## 2022-07-13 ENCOUNTER — Other Ambulatory Visit (HOSPITAL_COMMUNITY)
Admission: RE | Admit: 2022-07-13 | Discharge: 2022-07-13 | Disposition: A | Discharge status: Home / Self Care | Payer: Medicaid Other | Source: Ambulatory Visit | Attending: Primary Care | Admitting: Primary Care

## 2022-07-13 VITALS — BP 118/78 | HR 65 | Resp 16 | Wt 180.2 lb

## 2022-07-13 DIAGNOSIS — F17210 Nicotine dependence, cigarettes, uncomplicated: Secondary | ICD-10-CM | POA: Diagnosis not present

## 2022-07-13 DIAGNOSIS — Z124 Encounter for screening for malignant neoplasm of cervix: Secondary | ICD-10-CM | POA: Diagnosis not present

## 2022-07-13 MED ORDER — BUPROPION HCL ER (XL) 150 MG PO TB24: 150.0000 mg | ORAL_TABLET | Freq: Every day | ORAL | 1 refills | Status: DC

## 2022-07-13 NOTE — Progress Notes (Signed)
  [facility]  WELL-WOMAN PHYSICAL & PAP Patient name: Samantha Rice MRN [id number]  Date of birth: 1965/04/28 Chief Complaint:   Gynecologic Exam  History of Present Illness:   Samantha Rice is a 57 y.o. No obstetric history on file. female being seen today for a routine well-woman exam.  yn CC:gyn   The current method of family planning is tubal ligation.  Patient's last menstrual period was 03/14/2014. Last pap 07/13/22.  Last mammogram: 03/09/22. Results were: normal. Family h/o breast cancer: No Last colonoscopy:  Review of Systems:    Denies any headaches, blurred vision, fatigue, shortness of breath, chest pain, abdominal pain, abnormal vaginal discharge/itching/odor/irritation, problems with periods, bowel movements, urination, or intercourse unless otherwise stated above.  Pertinent History Reviewed:   Reviewed past medical,surgical, social and family history.  Reviewed problem list, medications and allergies.  Physical Assessment:   Vitals:   07/13/22 1103  BP: 118/78  Pulse: 65  Resp: 16  SpO2: 99%  Weight: 180 lb 3.2 oz (81.7 kg)  Body mass index is 34.05 kg/m.        Physical Examination:  General appearance - well appearing, and in no distress Mental status - alert, oriented to person, place, and time Psych:  She has a normal mood and affect Skin - warm and dry, normal color, no suspicious lesions noted Chest - effort normal, all lung fields clear to auscultation bilaterally Heart - normal rate and regular rhythm Neck:  midline trachea, no thyromegaly or nodules Breasts - breasts appear normal, no suspicious masses, no skin or nipple changes or axillary nodes Educated patient on proper self breast examination and had patient to demonstrate SBE. Abdomen - soft, nontender, nondistended, no masses or organomegaly Pelvic-VULVA: normal appearing vulva with no masses, tenderness or lesions   VAGINA: normal appearing vagina with normal color  and discharge, no lesions   CERVIX: normal appearing cervix without discharge or lesions, no CMT UTERUS: uterus is felt to be normal size, shape, consistency and nontender  ADNEXA: No adnexal masses or tenderness noted. Extremities:  No swelling or varicosities noted  No results found for this or any previous visit (from the past 24 hour(s)).   Assessment & Plan:  Samantha Rice was seen today for gynecologic exam.  Diagnoses and all orders for this visit:  Cervical cancer screening -     Cytology - PAP -     Cervicovaginal ancillary only  Heavy cigarette smoker (20-39 per day) -     CT CHEST LUNG CA SCREEN LOW DOSE W/O CM; Future -     buPROPion (WELLBUTRIN XL) 150 MG 24 hr tablet; Take 1 tablet (150 mg total) by mouth daily.    Orders Placed This Encounter  Procedures   CT CHEST LUNG CA SCREEN LOW DOSE W/O CM    Meds: No orders of the defined types were placed in this encounter.   Follow-up: No follow-ups on file.  This note has been created with Education officer, environmental. Any transcriptional errors are unintentional.   Grayce Sessions, NP 07/13/2022, 11:16 AM

## 2022-07-17 LAB — CERVICOVAGINAL ANCILLARY ONLY
Bacterial Vaginitis (gardnerella): NEGATIVE
Candida Glabrata: NEGATIVE
Candida Vaginitis: NEGATIVE
Chlamydia: NEGATIVE
Comment: NEGATIVE
Comment: NEGATIVE
Comment: NEGATIVE
Comment: NEGATIVE
Comment: NEGATIVE
Comment: NORMAL
Neisseria Gonorrhea: NEGATIVE
Trichomonas: NEGATIVE

## 2022-07-18 LAB — CYTOLOGY - PAP
Adequacy: ABSENT
Comment: NEGATIVE
Diagnosis: NEGATIVE
High risk HPV: NEGATIVE

## 2022-08-17 ENCOUNTER — Ambulatory Visit
Admission: RE | Admit: 2022-08-17 | Discharge: 2022-08-17 | Disposition: A | Discharge status: Home / Self Care | Payer: Medicaid Other | Source: Ambulatory Visit | Attending: Primary Care | Admitting: Primary Care

## 2022-08-17 DIAGNOSIS — F17210 Nicotine dependence, cigarettes, uncomplicated: Secondary | ICD-10-CM | POA: Diagnosis not present

## 2022-08-21 ENCOUNTER — Telehealth (INDEPENDENT_AMBULATORY_CARE_PROVIDER_SITE_OTHER): Payer: Self-pay | Admitting: Primary Care

## 2022-08-21 NOTE — Telephone Encounter (Signed)
Will forward to provider  

## 2022-08-21 NOTE — Telephone Encounter (Signed)
[facility] is calling to report that lung cancer screening results are back. Please advise CB- [phone]

## 2022-08-22 ENCOUNTER — Encounter: Payer: Self-pay | Admitting: Critical Care Medicine

## 2022-08-22 ENCOUNTER — Telehealth: Payer: Self-pay | Admitting: Critical Care Medicine

## 2022-08-22 DIAGNOSIS — J432 Centrilobular emphysema: Secondary | ICD-10-CM | POA: Insufficient documentation

## 2022-08-22 DIAGNOSIS — I2510 Atherosclerotic heart disease of native coronary artery without angina pectoris: Secondary | ICD-10-CM | POA: Insufficient documentation

## 2022-08-22 DIAGNOSIS — I700 Atherosclerosis of aorta: Secondary | ICD-10-CM | POA: Insufficient documentation

## 2022-08-22 DIAGNOSIS — K769 Liver disease, unspecified: Secondary | ICD-10-CM | POA: Insufficient documentation

## 2022-08-22 NOTE — Telephone Encounter (Signed)
I spoke to the patient regarding her CT scan of her chest.  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  I recommend we recheck her lipid panel.  The lipid panel in January was not resulted for some reason needs to be recollected.  If the LDL is greater than 70 she would benefit from rosuvastatin 20 mg daily  She has paraseptal emphysema she needs to quit smoking right now does not need inhalers  There are small tiny lung nodules likely benign she needs to be reimaged with a low-dose CT scan in 1 year  There is an indeterminant lesion in her liver that they recommend obtaining an MRI with and without contrast of the liver I will go ahead and order that for you Marcelino Duster  No other findings are of importance.

## 2022-08-24 ENCOUNTER — Encounter: Payer: Self-pay | Admitting: Critical Care Medicine

## 2022-08-30 ENCOUNTER — Ambulatory Visit
Admission: RE | Admit: 2022-08-30 | Discharge: 2022-08-30 | Disposition: A | Discharge status: Home / Self Care | Payer: Medicaid Other | Source: Ambulatory Visit | Attending: Critical Care Medicine | Admitting: Critical Care Medicine

## 2022-08-30 DIAGNOSIS — K760 Fatty (change of) liver, not elsewhere classified: Secondary | ICD-10-CM | POA: Diagnosis not present

## 2022-08-30 DIAGNOSIS — K769 Liver disease, unspecified: Secondary | ICD-10-CM

## 2022-08-30 DIAGNOSIS — R932 Abnormal findings on diagnostic imaging of liver and biliary tract: Secondary | ICD-10-CM | POA: Diagnosis not present

## 2022-08-30 MED ORDER — GADOPICLENOL 0.5 MMOL/ML IV SOLN
8.0000 mL | Freq: Once | INTRAVENOUS | Status: AC | PRN
  Administered 2022-08-30: 8 mL via INTRAVENOUS

## 2022-09-12 ENCOUNTER — Ambulatory Visit (INDEPENDENT_AMBULATORY_CARE_PROVIDER_SITE_OTHER): Payer: Medicaid Other | Admitting: Primary Care

## 2022-09-12 VITALS — BP 121/78 | HR 86 | Resp 16 | Ht 61.0 in | Wt 185.2 lb

## 2022-09-12 DIAGNOSIS — I10 Essential (primary) hypertension: Secondary | ICD-10-CM

## 2022-09-12 DIAGNOSIS — E1110 Type 2 diabetes mellitus with ketoacidosis without coma: Secondary | ICD-10-CM

## 2022-09-12 DIAGNOSIS — E6609 Other obesity due to excess calories: Secondary | ICD-10-CM

## 2022-09-12 DIAGNOSIS — Z7984 Long term (current) use of oral hypoglycemic drugs: Secondary | ICD-10-CM | POA: Diagnosis not present

## 2022-09-12 DIAGNOSIS — Z6834 Body mass index (BMI) 34.0-34.9, adult: Secondary | ICD-10-CM | POA: Diagnosis not present

## 2022-09-12 LAB — POCT GLYCOSYLATED HEMOGLOBIN (HGB A1C): HbA1c, POC (controlled diabetic range): 6.8 % (ref 0.0–7.0)

## 2022-09-12 NOTE — Progress Notes (Signed)
[facility]  Samantha Rice, is a 57 y.o. female  [id number]  [id number]  DOB - 05/01/1965  Chief Complaint  Patient presents with   Diabetes   Hypertension       Subjective:   [name] is a 56 y.o. female here today for a follow up visit for the management of HTN and Diabetes. Patient has No headache, No chest pain, No abdominal pain - No Nausea, No new weakness tingling or numbness, No Cough - shortness of breath. Denies polyuria, polydipsia, polyphasia or vision changes.  Does not check blood sugars at home.   No problems updated.  Allergies  Allergen Reactions   Naproxen     Per patient caused facial swelling.    Past Medical History:  Diagnosis Date   Diabetes mellitus without complication (HCC)    Hypertension     Current Outpatient Medications on File Prior to Visit  Medication Sig Dispense Refill   buPROPion (WELLBUTRIN XL) 150 MG 24 hr tablet Take 1 tablet (150 mg total) by mouth daily. 180 tablet 1   empagliflozin (JARDIANCE) 10 MG TABS tablet Take 1 tablet (10 mg total) by mouth daily before breakfast. 30 tablet 0   fluticasone (FLONASE) 50 MCG/ACT nasal spray Place 2 sprays into both nostrils daily as needed for allergies.     ibuprofen (ADVIL) 600 MG tablet Take 1 tablet (600 mg total) by mouth every 8 (eight) hours as needed. 30 tablet 0   metFORMIN (GLUCOPHAGE) 1000 MG tablet Take 1 tablet (1,000 mg total) by mouth 2 (two) times daily with a meal. 60 tablet 0   traMADol (ULTRAM) 50 MG tablet 1 po q hs prn pain 25 tablet 0   valACYclovir (VALTREX) 500 MG tablet Take 1 tablet (500 mg total) by mouth daily as needed (for outbreaks). 30 tablet 0   valsartan-hydrochlorothiazide (DIOVAN-HCT) 80-12.5 MG tablet Take 1 tablet by mouth daily. 30 tablet 0   Current Facility-Administered Medications on File Prior to Visit  Medication Dose Route Frequency Provider Last Rate Last Admin   0.9 %  sodium chloride infusion  500 mL  Intravenous Once Rachael Fee, MD        Objective:   Vitals:   09/12/22 1034  BP: 121/78  Pulse: 86  Resp: 16  SpO2: 98%  Weight: 185 lb 3.2 oz (84 kg)  Height: 5\' 1"  (1.549 m)    Comprehensive ROS Pertinent positive and negative noted in HPI   Exam General appearance : Awake, alert, not in any distress. Speech Clear. Not toxic looking HEENT: Atraumatic and Normocephalic, pupils equally reactive to light and accomodation Neck: Supple, no JVD. No cervical lymphadenopathy.  Chest: Good air entry bilaterally, no added sounds  CVS: S1 S2 regular, no murmurs.  Abdomen: Bowel sounds present, Non tender and not distended with no gaurding, rigidity or rebound. Extremities: B/L Lower Ext shows no edema, both legs are warm to touch Neurology: Awake alert, and oriented X 3, CN II-XII intact, Non focal Skin: No Rash  Data Review Lab Results  Component Value Date   HGBA1C 9.5 (H) 03/15/2022   HGBA1C 6.3 (A) 02/14/2018    Assessment & Plan  Samantha Rice was seen today for diabetes and hypertension.  Diagnoses and all orders for this visit:  DM (diabetes mellitus) type 2, uncontrolled, with ketoacidosis (HCC) - educated on lifestyle modifications, including but not limited to diet choices and adding exercise to daily routine.   -     POCT glycosylated hemoglobin (Hb A1C)  Essential hypertension BP goal - < 130/80 Well controlled  Explained that having normal blood pressure is the goal and medications are helping to get to goal and maintain normal blood pressure. DIET: Limit salt intake, read nutrition labels to check salt content, limit fried and high fatty foods  Avoid using multisymptom OTC cold preparations that generally contain sudafed which can rise BP. Consult with pharmacist on best cold relief products to use for persons with HTN EXERCISE Discussed incorporating exercise such as walking - 30 minutes most days of the week and can do in 10 minute intervals     Class 1  obesity due to excess calories without serious comorbidity with body mass index (BMI) of 34.0 to 34.9 in adult Obesity is 30-39 indicating an excess in caloric intake or underlining conditions. This may lead to other co-morbidities. Educated on lifestyle modifications of diet and exercise which may reduce obesity.        Patient have been counseled extensively about nutrition and exercise. Other issues discussed during this visit include: low cholesterol diet, weight control and daily exercise, foot care, annual eye examinations at Ophthalmology, importance of adherence with medications and regular follow-up. We also discussed long term complications of uncontrolled diabetes and hypertension.   No follow-ups on file.  The patient was given clear instructions to go to ER or return to medical center if symptoms don't improve, worsen or new problems develop. The patient verbalized understanding. The patient was told to call to get lab results if they haven't heard anything in the next week.   This note has been created with Education officer, environmental. Any transcriptional errors are unintentional.   Grayce Sessions, NP 09/12/2022, 10:38 AM

## 2022-09-12 NOTE — Patient Instructions (Signed)
Calorie Counting for Weight Loss Calories are units of energy. Your body needs a certain number of calories from food to keep going throughout the day. When you eat or drink more calories than your body needs, your body stores the extra calories mostly as fat. When you eat or drink fewer calories than your body needs, your body burns fat to get the energy it needs. Calorie counting means keeping track of how many calories you eat and drink each day. Calorie counting can be helpful if you need to lose weight. If you eat fewer calories than your body needs, you should lose weight. Ask your health care provider what a healthy weight is for you. For calorie counting to work, you will need to eat the right number of calories each day to lose a healthy amount of weight per week. A dietitian can help you figure out how many calories you need in a day and will suggest ways to reach your calorie goal. A healthy amount of weight to lose each week is usually 1-2 lb (0.5-0.9 kg). This usually means that your daily calorie intake should be reduced by 500-750 calories. Eating 1,200-1,500 calories a day can help most women lose weight. Eating 1,500-1,800 calories a day can help most men lose weight. What do I need to know about calorie counting? Work with your health care provider or dietitian to determine how many calories you should get each day. To meet your daily calorie goal, you will need to: Find out how many calories are in each food that you would like to eat. Try to do this before you eat. Decide how much of the food you plan to eat. Keep a food log. Do this by writing down what you ate and how many calories it had. To successfully lose weight, it is important to balance calorie counting with a healthy lifestyle that includes regular activity. Where do I find calorie information?  The number of calories in a food can be found on a Nutrition Facts label. If a food does not have a Nutrition Facts label, try  to look up the calories online or ask your dietitian for help. Remember that calories are listed per serving. If you choose to have more than one serving of a food, you will have to multiply the calories per serving by the number of servings you plan to eat. For example, the label on a package of bread might say that a serving size is 1 slice and that there are 90 calories in a serving. If you eat 1 slice, you will have eaten 90 calories. If you eat 2 slices, you will have eaten 180 calories. How do I keep a food log? After each time that you eat, record the following in your food log as soon as possible: What you ate. Be sure to include toppings, sauces, and other extras on the food. How much you ate. This can be measured in cups, ounces, or number of items. How many calories were in each food and drink. The total number of calories in the food you ate. Keep your food log near you, such as in a pocket-sized notebook or on an app or website on your mobile phone. Some programs will calculate calories for you and show you how many calories you have left to meet your daily goal. What are some portion-control tips? Know how many calories are in a serving. This will help you know how many servings you can have of a certain   food. Use a measuring cup to measure serving sizes. You could also try weighing out portions on a kitchen scale. With time, you will be able to estimate serving sizes for some foods. Take time to put servings of different foods on your favorite plates or in your favorite bowls and cups so you know what a serving looks like. Try not to eat straight from a food's packaging, such as from a bag or box. Eating straight from the package makes it hard to see how much you are eating and can lead to overeating. Put the amount you would like to eat in a cup or on a plate to make sure you are eating the right portion. Use smaller plates, glasses, and bowls for smaller portions and to prevent  overeating. Try not to multitask. For example, avoid watching TV or using your computer while eating. If it is time to eat, sit down at a table and enjoy your food. This will help you recognize when you are full. It will also help you be more mindful of what and how much you are eating. What are tips for following this plan? Reading food labels Check the calorie count compared with the serving size. The serving size may be smaller than what you are used to eating. Check the source of the calories. Try to choose foods that are high in protein, fiber, and vitamins, and low in saturated fat, trans fat, and sodium. Shopping Read nutrition labels while you shop. This will help you make healthy decisions about which foods to buy. Pay attention to nutrition labels for low-fat or fat-free foods. These foods sometimes have the same number of calories or more calories than the full-fat versions. They also often have added sugar, starch, or salt to make up for flavor that was removed with the fat. Make a grocery list of lower-calorie foods and stick to it. Cooking Try to cook your favorite foods in a healthier way. For example, try baking instead of frying. Use low-fat dairy products. Meal planning Use more fruits and vegetables. One-half of your plate should be fruits and vegetables. Include lean proteins, such as chicken, turkey, and fish. Lifestyle Each week, aim to do one of the following: 150 minutes of moderate exercise, such as walking. 75 minutes of vigorous exercise, such as running. General information Know how many calories are in the foods you eat most often. This will help you calculate calorie counts faster. Find a way of tracking calories that works for you. Get creative. Try different apps or programs if writing down calories does not work for you. What foods should I eat?  Eat nutritious foods. It is better to have a nutritious, high-calorie food, such as an avocado, than a food with  few nutrients, such as a bag of potato chips. Use your calories on foods and drinks that will fill you up and will not leave you hungry soon after eating. Examples of foods that fill you up are nuts and nut butters, vegetables, lean proteins, and high-fiber foods such as whole grains. High-fiber foods are foods with more than 5 g of fiber per serving. Pay attention to calories in drinks. Low-calorie drinks include water and unsweetened drinks. The items listed above may not be a complete list of foods and beverages you can eat. Contact a dietitian for more information. What foods should I limit? Limit foods or drinks that are not good sources of vitamins, minerals, or protein or that are high in unhealthy fats. These   include: Candy. Other sweets. Sodas, specialty coffee drinks, alcohol, and juice. The items listed above may not be a complete list of foods and beverages you should avoid. Contact a dietitian for more information. How do I count calories when eating out? Pay attention to portions. Often, portions are much larger when eating out. Try these tips to keep portions smaller: Consider sharing a meal instead of getting your own. If you get your own meal, eat only half of it. Before you start eating, ask for a container and put half of your meal into it. When available, consider ordering smaller portions from the menu instead of full portions. Pay attention to your food and drink choices. Knowing the way food is cooked and what is included with the meal can help you eat fewer calories. If calories are listed on the menu, choose the lower-calorie options. Choose dishes that include vegetables, fruits, whole grains, low-fat dairy products, and lean proteins. Choose items that are boiled, broiled, grilled, or steamed. Avoid items that are buttered, battered, fried, or served with cream sauce. Items labeled as crispy are usually fried, unless stated otherwise. Choose water, low-fat milk,  unsweetened iced tea, or other drinks without added sugar. If you want an alcoholic beverage, choose a lower-calorie option, such as a glass of wine or light beer. Ask for dressings, sauces, and syrups on the side. These are usually high in calories, so you should limit the amount you eat. If you want a salad, choose a garden salad and ask for grilled meats. Avoid extra toppings such as bacon, cheese, or fried items. Ask for the dressing on the side, or ask for olive oil and vinegar or lemon to use as dressing. Estimate how many servings of a food you are given. Knowing serving sizes will help you be aware of how much food you are eating at restaurants. Where to find more information Centers for Disease Control and Prevention: www.cdc.gov [place] Department of Agriculture: myplate.gov Summary Calorie counting means keeping track of how many calories you eat and drink each day. If you eat fewer calories than your body needs, you should lose weight. A healthy amount of weight to lose per week is usually 1-2 lb (0.5-0.9 kg). This usually means reducing your daily calorie intake by 500-750 calories. The number of calories in a food can be found on a Nutrition Facts label. If a food does not have a Nutrition Facts label, try to look up the calories online or ask your dietitian for help. Use smaller plates, glasses, and bowls for smaller portions and to prevent overeating. Use your calories on foods and drinks that will fill you up and not leave you hungry shortly after a meal. This information is not intended to replace advice given to you by your health care provider. Make sure you discuss any questions you have with your health care provider. Document Revised: 03/27/2019 Document Reviewed: 03/27/2019 Elsevier Patient Education  2023 [employer] Inc.  

## 2022-09-13 LAB — MICROALBUMIN / CREATININE URINE RATIO
Creatinine, Urine: 57 mg/dL
Microalb/Creat Ratio: <5 mg/g creat (ref 0–29)
Microalbumin, Urine: <3 ug/mL

## 2022-09-26 ENCOUNTER — Ambulatory Visit (INDEPENDENT_AMBULATORY_CARE_PROVIDER_SITE_OTHER): Payer: Medicaid Other

## 2022-10-02 ENCOUNTER — Ambulatory Visit (INDEPENDENT_AMBULATORY_CARE_PROVIDER_SITE_OTHER): Payer: Medicaid Other

## 2022-10-02 DIAGNOSIS — Z111 Encounter for screening for respiratory tuberculosis: Secondary | ICD-10-CM

## 2022-10-02 NOTE — Progress Notes (Signed)
Tuberculin skin test applied to left ventral forearm.  Patient informed to schedule appt for nurse visit in 48-72 hours to have site read.  Herbert Deaner, RMA

## 2022-10-04 ENCOUNTER — Ambulatory Visit (INDEPENDENT_AMBULATORY_CARE_PROVIDER_SITE_OTHER): Payer: Medicaid Other

## 2022-10-11 ENCOUNTER — Telehealth (INDEPENDENT_AMBULATORY_CARE_PROVIDER_SITE_OTHER): Payer: Self-pay | Admitting: Primary Care

## 2022-10-11 NOTE — Telephone Encounter (Signed)
Spoke to pt. Will be at apt.  

## 2022-10-12 ENCOUNTER — Ambulatory Visit (INDEPENDENT_AMBULATORY_CARE_PROVIDER_SITE_OTHER): Payer: Medicaid Other | Admitting: Primary Care

## 2022-10-12 ENCOUNTER — Encounter (INDEPENDENT_AMBULATORY_CARE_PROVIDER_SITE_OTHER): Payer: Self-pay | Admitting: Primary Care

## 2022-10-12 ENCOUNTER — Other Ambulatory Visit (INDEPENDENT_AMBULATORY_CARE_PROVIDER_SITE_OTHER): Payer: Self-pay | Admitting: Primary Care

## 2022-10-12 VITALS — BP 136/88 | HR 78 | Resp 16 | Wt 186.0 lb

## 2022-10-12 DIAGNOSIS — E049 Nontoxic goiter, unspecified: Secondary | ICD-10-CM

## 2022-10-12 DIAGNOSIS — L309 Dermatitis, unspecified: Secondary | ICD-10-CM | POA: Diagnosis not present

## 2022-10-12 DIAGNOSIS — R635 Abnormal weight gain: Secondary | ICD-10-CM

## 2022-10-12 MED ORDER — TRIAMCINOLONE ACETONIDE 0.5 % EX CREA: TOPICAL_CREAM | Freq: Three times a day (TID) | CUTANEOUS | 1 refills | Status: DC | PRN

## 2022-10-12 NOTE — Progress Notes (Signed)
      [facility]    Subjective:     Samantha Rice is a 57 y.o. female who presents for evaluation of unintentional weight gain , eczema  , throat feels funny maybe thyroid. Associated symptoms include irritated throat. Onset of symptoms was 4 days ago, and have been stable since that time. She is drinking moderate amounts of fluids.   The following portions of the patient's history were reviewed and updated as appropriate: allergies, current medications, past family history, past medical history, past social history, past surgical history, and problem list.  Review of Systems Pertinent items noted in HPI and remainder of comprehensive ROS otherwise negative.    Objective:  Blood Pressure 136/88 (BP Location: Right Arm, Patient Position: Sitting, Cuff Size: Large)   Pulse 78   Respiration 16   Weight 186 lb (84.4 kg)   Last Menstrual Period 03/14/2014   Oxygen Saturation 98%   Body Mass Index 35.14 kg/m    Assessment:  Samantha Rice was seen today for mass.  Diagnoses and all orders for this visit:  Weight gain -     TSH + free T4  Thyroid enlarged -     TSH + free T4  Eczema, unspecified type  triamcinolone 0.5%-Eucerin equivalent 1:1 cream mixture; Apply topically 3 (three) times daily as needed.    This note has been created with Education officer, environmental. Any transcriptional errors are unintentional.   Grayce Sessions, NP 10/13/2022, 3:46 PM

## 2022-10-13 LAB — TSH+FREE T4
Free T4: 1.11 ng/dL (ref 0.82–1.77)
TSH: 1.95 u[IU]/mL (ref 0.450–4.500)

## 2022-10-17 ENCOUNTER — Other Ambulatory Visit (INDEPENDENT_AMBULATORY_CARE_PROVIDER_SITE_OTHER): Payer: Self-pay | Admitting: Primary Care

## 2022-10-18 ENCOUNTER — Other Ambulatory Visit (INDEPENDENT_AMBULATORY_CARE_PROVIDER_SITE_OTHER): Payer: Self-pay | Admitting: Primary Care

## 2022-10-18 DIAGNOSIS — E1110 Type 2 diabetes mellitus with ketoacidosis without coma: Secondary | ICD-10-CM

## 2022-10-18 MED ORDER — METFORMIN HCL 1000 MG PO TABS: 1000.0000 mg | ORAL_TABLET | Freq: Two times a day (BID) | ORAL | 1 refills | Status: DC

## 2022-10-18 MED ORDER — EMPAGLIFLOZIN 10 MG PO TABS: 10.0000 mg | ORAL_TABLET | Freq: Every day | ORAL | 1 refills | Status: DC

## 2022-10-18 MED ORDER — VALACYCLOVIR HCL 500 MG PO TABS: 500.0000 mg | ORAL_TABLET | Freq: Every day | ORAL | 0 refills | Status: AC | PRN

## 2022-12-04 DIAGNOSIS — H5213 Myopia, bilateral: Secondary | ICD-10-CM | POA: Diagnosis not present

## 2023-03-15 ENCOUNTER — Ambulatory Visit (INDEPENDENT_AMBULATORY_CARE_PROVIDER_SITE_OTHER): Payer: Medicaid Other | Admitting: Primary Care

## 2023-03-30 ENCOUNTER — Ambulatory Visit (INDEPENDENT_AMBULATORY_CARE_PROVIDER_SITE_OTHER): Payer: Medicaid Other | Admitting: Primary Care

## 2023-03-30 ENCOUNTER — Encounter (INDEPENDENT_AMBULATORY_CARE_PROVIDER_SITE_OTHER): Payer: Self-pay | Admitting: Primary Care

## 2023-03-30 VITALS — BP 145/78 | HR 92 | Ht 61.0 in | Wt 188.2 lb

## 2023-03-30 DIAGNOSIS — E1110 Type 2 diabetes mellitus with ketoacidosis without coma: Secondary | ICD-10-CM | POA: Diagnosis not present

## 2023-03-30 DIAGNOSIS — I10 Essential (primary) hypertension: Secondary | ICD-10-CM

## 2023-03-30 DIAGNOSIS — E66812 Obesity, class 2: Secondary | ICD-10-CM

## 2023-03-30 DIAGNOSIS — Z6835 Body mass index (BMI) 35.0-35.9, adult: Secondary | ICD-10-CM

## 2023-03-30 DIAGNOSIS — E669 Obesity, unspecified: Secondary | ICD-10-CM

## 2023-03-30 DIAGNOSIS — L819 Disorder of pigmentation, unspecified: Secondary | ICD-10-CM

## 2023-03-30 DIAGNOSIS — Z7984 Long term (current) use of oral hypoglycemic drugs: Secondary | ICD-10-CM

## 2023-03-30 DIAGNOSIS — Z131 Encounter for screening for diabetes mellitus: Secondary | ICD-10-CM

## 2023-03-30 DIAGNOSIS — E119 Type 2 diabetes mellitus without complications: Secondary | ICD-10-CM

## 2023-03-30 LAB — POCT GLYCOSYLATED HEMOGLOBIN (HGB A1C): HbA1c, POC (controlled diabetic range): 7.1 % — AB (ref 0.0–7.0)

## 2023-03-30 MED ORDER — VALSARTAN-HYDROCHLOROTHIAZIDE 80-12.5 MG PO TABS: 1.0000 | ORAL_TABLET | Freq: Every day | ORAL | 1 refills | Status: AC

## 2023-03-30 MED ORDER — METFORMIN HCL 1000 MG PO TABS: 1000.0000 mg | ORAL_TABLET | Freq: Two times a day (BID) | ORAL | 1 refills | Status: AC

## 2023-03-30 MED ORDER — EMPAGLIFLOZIN 10 MG PO TABS: 10.0000 mg | ORAL_TABLET | Freq: Every day | ORAL | 1 refills | Status: AC

## 2023-03-31 LAB — RPR: RPR Ser Ql: NONREACTIVE

## 2023-04-01 ENCOUNTER — Encounter (INDEPENDENT_AMBULATORY_CARE_PROVIDER_SITE_OTHER): Payer: Self-pay | Admitting: Primary Care

## 2023-04-16 ENCOUNTER — Encounter (INDEPENDENT_AMBULATORY_CARE_PROVIDER_SITE_OTHER): Payer: Self-pay | Admitting: Primary Care

## 2023-04-16 NOTE — Progress Notes (Signed)
[facility]  Cheray Pardi, is a 58 y.o. female  [id number]  [id number]  DOB - [birth year]  Chief Complaint  Patient presents with   Medical Management of Chronic Issues    Spot on foot       Subjective:   Shawnita Krizek is a 58 y.o. female here today for an acute visit. She is concern with changes in her skin color unknown etiology. Bp is elevated. Patient has No headache, No chest pain, No abdominal pain - No Nausea, No new weakness tingling or numbness, No Cough - shortness of breath   No problems updated.  Comprehensive ROS Pertinent positive and negative noted in HPI   Allergies  Allergen Reactions   Naproxen     Per patient caused facial swelling.    Past Medical History:  Diagnosis Date   Diabetes mellitus without complication (HCC)    Hypertension     Current Outpatient Medications on File Prior to Visit  Medication Sig Dispense Refill   valACYclovir (VALTREX) 500 MG tablet Take 1 tablet (500 mg total) by mouth daily as needed (for outbreaks). 30 tablet 0   traMADol (ULTRAM) 50 MG tablet 1 po q hs prn pain (Patient not taking: Reported on 03/30/2023) 25 tablet 0   triamcinolone cream (KENALOG) 0.5 % APPLY TOPICALLY 3 TIMES DAILY AS NEEDED. (Patient not taking: Reported on 03/30/2023) 453 g 1   Current Facility-Administered Medications on File Prior to Visit  Medication Dose Route Frequency Provider Last Rate Last Admin   0.9 %  sodium chloride infusion  500 mL Intravenous Once Rachael Fee, MD       Health Maintenance  Topic Date Due   Pneumococcal Vaccination (2 of 2 - PCV) 10/30/2015   Zoster (Shingles) Vaccine (1 of 2) Never done   COVID-19 Vaccine (1 - 2024-25 season) Never done   Yearly kidney function blood test for diabetes  03/16/2023   Flu Shot  05/28/2023*   DTaP/Tdap/Td vaccine (1 - Tdap) 03/29/2024*   Screening for Lung Cancer  08/17/2023   Yearly kidney health urinalysis for diabetes  09/12/2023   Mammogram   03/09/2024   Colon Cancer Screening  06/10/2026   Pap with HPV screening  07/13/2027   Hepatitis C Screening  Completed   HIV Screening  Completed   HPV Vaccine  Aged Out  *Topic was postponed. The date shown is not the original due date.    Objective:   Vitals:   03/30/23 0954  BP: (!) 145/78  Pulse: 92  SpO2: 95%  Weight: 188 lb 3.2 oz (85.4 kg)  Height: 5\' 1"  (1.549 m)   Wt Readings from Last 3 Encounters:  03/30/23 188 lb 3.2 oz (85.4 kg)  10/12/22 186 lb (84.4 kg)  09/12/22 185 lb 3.2 oz (84 kg)      Physical Exam Vitals reviewed.  Constitutional:      Appearance: She is obese.  HENT:     Head: Normocephalic.     Right Ear: Tympanic membrane and external ear normal.     Left Ear: Tympanic membrane and external ear normal.  Eyes:     Extraocular Movements: Extraocular movements intact.     Pupils: Pupils are equal, round, and reactive to light.  Cardiovascular:     Rate and Rhythm: Normal rate and regular rhythm.  Pulmonary:     Effort: Pulmonary effort is normal.     Breath sounds: Normal breath sounds.  Abdominal:     General: Bowel sounds are normal.  There is distension.     Palpations: Abdomen is soft.  Musculoskeletal:        General: Normal range of motion.     Cervical back: Normal range of motion and neck supple.  Skin:    Comments: See picture  Neurological:     Mental Status: She is alert and oriented to person, place, and time.  Psychiatric:        Mood and Affect: Mood normal.        Behavior: Behavior normal.      Assessment & Plan   Iver was seen today for medical management of chronic issues.  Diagnoses and all orders for this visit:  Abnormal pigmentation  -     RPR negative  Referred to podiatry   Type 2 diabetes mellitus without complication, without long-term current use of insulin (HCC) - educated on lifestyle modifications, including but not limited to diet choices and adding exercise to daily routine.   -      empagliflozin (JARDIANCE) 10 MG TABS tablet; Take 1 tablet (10 mg total) by mouth daily before breakfast. -     metFORMIN (GLUCOPHAGE) 1000 MG tablet; Take 1 tablet (1,000 mg total) by mouth 2 (two) times daily with a meal.  Essential hypertension BP goal - < 130/80 Explained that having normal blood pressure is the goal and medications are helping to get to goal and maintain normal blood pressure. DIET: Limit salt intake, read nutrition labels to check salt content, limit fried and high fatty foods  Avoid using multisymptom OTC cold preparations that generally contain sudafed which can rise BP. Consult with pharmacist on best cold relief products to use for persons with HTN EXERCISE Discussed incorporating exercise such as walking - 30 minutes most days of the week and can do in 10 minute intervals    -     valsartan-hydrochlorothiazide (DIOVAN-HCT) 80-12.5 MG tablet; Take 1 tablet by mouth daily.  Patient have been counseled extensively about nutrition and exercise. Other issues discussed during this visit include: low cholesterol diet, weight control and daily exercise, foot care, annual eye examinations at Ophthalmology, importance of adherence with medications and regular follow-up. We also discussed long term complications of uncontrolled diabetes and hypertension.   Return in about 3 months (around 06/27/2023) for medical conditions HTN.  The patient was given clear instructions to go to ER or return to medical center if symptoms don't improve, worsen or new problems develop. The patient verbalized understanding. The patient was told to call to get lab results if they haven't heard anything in the next week.   This note has been created with Education officer, environmental. Any transcriptional errors are unintentional.   Grayce Sessions, NP 04/16/2023, 2:11 PM

## 2023-04-30 DIAGNOSIS — E119 Type 2 diabetes mellitus without complications: Secondary | ICD-10-CM | POA: Diagnosis not present

## 2023-04-30 DIAGNOSIS — F17200 Nicotine dependence, unspecified, uncomplicated: Secondary | ICD-10-CM | POA: Diagnosis not present

## 2023-04-30 DIAGNOSIS — Z9189 Other specified personal risk factors, not elsewhere classified: Secondary | ICD-10-CM | POA: Diagnosis not present

## 2023-04-30 DIAGNOSIS — I10 Essential (primary) hypertension: Secondary | ICD-10-CM | POA: Diagnosis not present

## 2023-04-30 DIAGNOSIS — Z13220 Encounter for screening for lipoid disorders: Secondary | ICD-10-CM | POA: Diagnosis not present

## 2023-04-30 DIAGNOSIS — Z0000 Encounter for general adult medical examination without abnormal findings: Secondary | ICD-10-CM | POA: Diagnosis not present

## 2023-05-01 ENCOUNTER — Ambulatory Visit: Payer: Medicaid Other | Admitting: Podiatry

## 2023-05-01 DIAGNOSIS — L300 Nummular dermatitis: Secondary | ICD-10-CM | POA: Insufficient documentation

## 2023-05-01 DIAGNOSIS — E119 Type 2 diabetes mellitus without complications: Secondary | ICD-10-CM

## 2023-05-01 DIAGNOSIS — L814 Other melanin hyperpigmentation: Secondary | ICD-10-CM

## 2023-05-01 NOTE — Progress Notes (Unsigned)
     Chief Complaint  Patient presents with   Skin Problem    Bilateral discoloration on bottoms of feet. Present for as long as she can remember, darkening over the past year. Questions about possible neuropathy. She does trim her own nails. Last A1c: 7.4. No anticoag.    HPI: 58 y.o. female presents today for a diabetic foot check.  Patient notes dulled sensation in the bilateral heels.  Denies lower back injury.  She presents with concern of multiple pigmented spots on the bottom of both feet.  She states her parents had this as well.  States this was not melanoma with her parents.  She feels the spots have gotten slightly darker in the past year.  And denies any pain with the pigmented areas.  Patient does take metformin and Jardiance for diabetes.  Her A1c 1 month ago was 7.1  Past Medical History:  Diagnosis Date   Diabetes mellitus without complication (HCC)    Hypertension     Past Surgical History:  Procedure Laterality Date   c cection      Allergies  Allergen Reactions   Naproxen     Per patient caused facial swelling.    Physical Exam: General: The patient is alert and oriented x3 in no acute distress.  Dermatology: Skin is warm, dry and supple bilateral lower extremities. Interspaces are clear of maceration and debris.  There are multiple, circular, nonraised hyperpigmented skin lesions on the plantar aspect of both feet.  Approximately 25-35 lesions observed per foot.  These have irregular shaped borders and are consistent with color throughout.  No pain on palpation of the lesions.  There is no scaling or crusting noted.  Vascular: Palpable pedal pulses bilaterally. Capillary refill within normal limits.  No appreciable edema.  No erythema or calor.  Neurological: Light touch sensation grossly intact bilateral feet.  Protective sensation intact with a Semmes Weinstein monofilament bilateral feet.  Vibratory sensation is intact.  Temperature sensation is intact.  Negative  Tinel's sign with percussion of the posterior tibial nerve bilateral  Musculoskeletal Exam: No pedal deformities noted  Assessment/Plan of Care: 1. Other melanin hyperpigmentation   2. Type 2 diabetes mellitus without complication, without long-term current use of insulin (HCC)    Discussed clinical findings with patient today.  Discussed the importance of proper blood sugar control with regard to neuropathy.  Patient will continue with good moisture management of the skin.  And inform the patient that these lesions appear to be benign but to keep an eye on the areas for any change in color, symptoms of pain, drainage, crusting, or irregular border.  This is an atypical finding and darker skinned individuals and the presentation is not consistent with melanoma.  Follow-up as needed, or annually, for a diabetic foot check   Samantha Rice DBurna Mortimer, DPM, FACFAS [facility]     [address] [place], [place] [address]                Office [phone]  Fax [phone]

## 2023-05-23 DIAGNOSIS — I10 Essential (primary) hypertension: Secondary | ICD-10-CM | POA: Diagnosis not present

## 2023-05-23 DIAGNOSIS — E119 Type 2 diabetes mellitus without complications: Secondary | ICD-10-CM | POA: Diagnosis not present

## 2023-05-23 DIAGNOSIS — F17200 Nicotine dependence, unspecified, uncomplicated: Secondary | ICD-10-CM | POA: Diagnosis not present

## 2023-05-23 DIAGNOSIS — E782 Mixed hyperlipidemia: Secondary | ICD-10-CM | POA: Diagnosis not present

## 2023-05-23 DIAGNOSIS — Z9189 Other specified personal risk factors, not elsewhere classified: Secondary | ICD-10-CM | POA: Diagnosis not present

## 2023-05-28 DIAGNOSIS — R0683 Snoring: Secondary | ICD-10-CM | POA: Diagnosis not present

## 2023-05-28 DIAGNOSIS — I10 Essential (primary) hypertension: Secondary | ICD-10-CM | POA: Diagnosis not present

## 2023-06-14 ENCOUNTER — Other Ambulatory Visit: Payer: Self-pay

## 2023-06-14 ENCOUNTER — Encounter (HOSPITAL_COMMUNITY): Payer: Self-pay

## 2023-06-14 ENCOUNTER — Emergency Department (HOSPITAL_COMMUNITY)
Admission: EM | Admit: 2023-06-14 | Discharge: 2023-06-14 | Disposition: A | Discharge status: Home / Self Care | Attending: Emergency Medicine | Admitting: Emergency Medicine

## 2023-06-14 ENCOUNTER — Emergency Department (HOSPITAL_BASED_OUTPATIENT_CLINIC_OR_DEPARTMENT_OTHER)

## 2023-06-14 DIAGNOSIS — I10 Essential (primary) hypertension: Secondary | ICD-10-CM | POA: Insufficient documentation

## 2023-06-14 DIAGNOSIS — Z7984 Long term (current) use of oral hypoglycemic drugs: Secondary | ICD-10-CM | POA: Insufficient documentation

## 2023-06-14 DIAGNOSIS — M79604 Pain in right leg: Secondary | ICD-10-CM

## 2023-06-14 DIAGNOSIS — M79661 Pain in right lower leg: Secondary | ICD-10-CM

## 2023-06-14 DIAGNOSIS — E119 Type 2 diabetes mellitus without complications: Secondary | ICD-10-CM | POA: Insufficient documentation

## 2023-06-14 DIAGNOSIS — I83811 Varicose veins of right lower extremities with pain: Secondary | ICD-10-CM | POA: Insufficient documentation

## 2023-06-14 DIAGNOSIS — Z79899 Other long term (current) drug therapy: Secondary | ICD-10-CM | POA: Insufficient documentation

## 2023-06-14 NOTE — Progress Notes (Signed)
 RLE venous duplex has been completed.  Preliminary results given to Bowie Tran, PA-C.   Results can be found under chart review under CV PROC. 06/14/2023 7:55 PM Raffi Milstein RVT, RDMS

## 2023-06-14 NOTE — ED Triage Notes (Signed)
 Patient has had right leg/calf pain and bruising since yesterday. Sore to touch. Ambulatory. Denies hitting leg on any object.

## 2023-06-14 NOTE — ED Notes (Signed)
 [place] at bedside

## 2023-06-14 NOTE — Discharge Instructions (Addendum)
 You have been evaluated for your symptoms.  Fortunately no evidence of blood clot causing your leg pain.  Your pain is likely due to varicose vein.  You may follow-up with vascular surgeon for outpatient evaluation and management.  You may use cool compress as needed for comfort and take over-the-counter Tylenol for pain.  Return if you have any concern.

## 2023-06-14 NOTE — ED Provider Triage Note (Signed)
 Emergency Medicine Provider Triage Evaluation Note  Natori Gudino , a 58 y.o. female  was evaluated in triage.  Pt complains of right calf pain and swelling for the past several days. Denies CP or SOB. No known injury to the leg.  Review of Systems  Positive: Right calf pain and swelling Negative: CP  Physical Exam  BP (!) 156/88   Pulse 95   Temp 98.8 F (37.1 C) (Oral)   Resp 19   Ht 5\' 1"  (1.549 m)   Wt 83.9 kg   LMP 03/14/2014   SpO2 100%   BMI 34.96 kg/m  Gen:   Awake, no distress   Resp:  Normal effort  MSK:   Moves extremities without difficulty  Other:    Medical Decision Making  Medically screening exam initiated at 6:51 PM.  Appropriate orders placed.  Winthrop Hawks was informed that the remainder of the evaluation will be completed by another provider, this initial triage assessment does not replace that evaluation, and the importance of remaining in the ED until their evaluation is complete.   Sonnie Dusky, PA-C 06/14/23 [id number]

## 2023-06-14 NOTE — ED Provider Notes (Signed)
 [place] EMERGENCY DEPARTMENT AT [facility] Provider Note   CSN: [id number] Arrival date & time: 06/14/23  1803     History  Chief Complaint  Patient presents with   Leg Pain    Samantha Rice is a 58 y.o. female.  The history is provided by the patient and medical records. No language interpreter was used.  Leg Pain    58 year old female history of diabetes, hypertension presenting with complaint of leg pain.  Patient reports she noticed pain to the lateral aspect of her right lower extremity that started last night and persisted throughout the day today.  Pain described as a throbbing sensation hurts to the touch.  She is unsure if she may have hit it against anything but she was concerns about her pain.  She does not endorse any fever or chills no chest pain or shortness of breath no numbness.  No prior history of PE or DVT.  No treatment tried.  Home Medications Prior to Admission medications   Medication Sig Start Date End Date Taking? Authorizing Provider  empagliflozin (JARDIANCE) 10 MG TABS tablet Take 1 tablet (10 mg total) by mouth daily before breakfast. 03/30/23   Grayce Sessions, NP  metFORMIN (GLUCOPHAGE) 1000 MG tablet Take 1 tablet (1,000 mg total) by mouth 2 (two) times daily with a meal. 03/30/23   Grayce Sessions, NP  traMADol (ULTRAM) 50 MG tablet 1 po q hs prn pain 03/29/22   Cammy Copa, MD  triamcinolone cream (KENALOG) 0.5 % APPLY TOPICALLY 3 TIMES DAILY AS NEEDED. 10/12/22   Grayce Sessions, NP  valACYclovir (VALTREX) 500 MG tablet Take 1 tablet (500 mg total) by mouth daily as needed (for outbreaks). 10/18/22   Grayce Sessions, NP  valsartan-hydrochlorothiazide (DIOVAN-HCT) 80-12.5 MG tablet Take 1 tablet by mouth daily. 03/30/23   Grayce Sessions, NP      Allergies    Naproxen    Review of Systems   Review of Systems  All other systems reviewed and are negative.   Physical Exam Updated Vital Signs BP (!) 156/88    Pulse 95   Temp 98.8 F (37.1 C) (Oral)   Resp 19   Ht 5\' 1"  (1.549 m)   Wt 83.9 kg   LMP 03/14/2014   SpO2 100%   BMI 34.96 kg/m  Physical Exam Vitals and nursing note reviewed.  Constitutional:      General: She is not in acute distress.    Appearance: She is well-developed.  HENT:     Head: Atraumatic.  Eyes:     Conjunctiva/sclera: Conjunctivae normal.  Pulmonary:     Effort: Pulmonary effort is normal.  Musculoskeletal:        General: Tenderness (Right lower extremity: varicose vein noted to the lateral proximal lower leg below the knee which is tender to palpation but no erythema edema or warmth appreciated.  Calf nontender no peripheral edema.  Pedal pulse 2+) present.     Cervical back: Neck supple.  Skin:    Findings: No rash.  Neurological:     Mental Status: She is alert.  Psychiatric:        Mood and Affect: Mood normal.     ED Results / Procedures / Treatments   Labs (all labs ordered are listed, but only abnormal results are displayed) Labs Reviewed - No data to display  EKG None  Radiology VAS [place] LOWER EXTREMITY VENOUS (DVT) (ONLY [facility] & [facility]) Result Date: 06/14/2023  Lower  Venous DVT Study Patient Name:  Samantha Rice  Date of Exam:   06/14/2023 Medical Rec #: [id number]       Accession #:    [id number] Date of Birth: 12-14-1965      Patient Gender: F Patient Age:   63 years Exam Location:  [facility] Procedure:      VAS [place] LOWER EXTREMITY VENOUS (DVT) Referring Phys: Maxwell Marion --------------------------------------------------------------------------------  Indications: Pain.  Comparison Study: No previous exams Performing Technologist: Jody Hill RVT, RDMS  Examination Guidelines: A complete evaluation includes B-mode imaging, spectral Doppler, color Doppler, and power Doppler as needed of all accessible portions of each vessel. Bilateral testing is considered an integral part of a complete examination. Limited examinations for reoccurring  indications may be performed as noted. The reflux portion of the exam is performed with the patient in reverse Trendelenburg.  +---------+---------------+---------+-----------+----------+--------------+ RIGHT    CompressibilityPhasicitySpontaneityPropertiesThrombus Aging +---------+---------------+---------+-----------+----------+--------------+ CFV      Full           Yes      Yes                                 +---------+---------------+---------+-----------+----------+--------------+ SFJ      Full                                                        +---------+---------------+---------+-----------+----------+--------------+ FV Prox  Full           Yes      Yes                                 +---------+---------------+---------+-----------+----------+--------------+ FV Mid   Full           Yes      Yes                                 +---------+---------------+---------+-----------+----------+--------------+ FV DistalFull           Yes      Yes                                 +---------+---------------+---------+-----------+----------+--------------+ PFV      Full                                                        +---------+---------------+---------+-----------+----------+--------------+ POP      Full           Yes      Yes                                 +---------+---------------+---------+-----------+----------+--------------+ PTV      Full                                                        +---------+---------------+---------+-----------+----------+--------------+  PERO     Full                                                        +---------+---------------+---------+-----------+----------+--------------+   +----+---------------+---------+-----------+----------+--------------+ LEFTCompressibilityPhasicitySpontaneityPropertiesThrombus Aging +----+---------------+---------+-----------+----------+--------------+ CFV Full            Yes      Yes                                 +----+---------------+---------+-----------+----------+--------------+     Summary: RIGHT: - There is no evidence of deep vein thrombosis in the lower extremity.  - No cystic structure found in the popliteal fossa.  LEFT: - No evidence of common femoral vein obstruction.   *See table(s) above for measurements and observations.    Preliminary     Procedures Procedures    Medications Ordered in ED Medications - No data to display  ED Course/ Medical Decision Making/ A&P                                 Medical Decision Making  BP (!) 151/81   Pulse 86   Temp 98.8 F (37.1 C) (Oral)   Resp 18   Ht 5\' 1"  (1.549 m)   Wt 83.9 kg   LMP 03/14/2014   SpO2 99%   BMI 34.96 kg/m   86:70 PM  58 year old female history of diabetes, hypertension presenting with complaint of leg pain.  Patient reports she noticed pain to the lateral aspect of her right lower extremity that started last night and persisted throughout the day today.  Pain described as a throbbing sensation hurts to the touch.  She is unsure if she may have hit it against anything but she was concerns about her pain.  She does not endorse any fever or chills no chest pain or shortness of breath no numbness.  No prior history of PE or DVT.  No treatment tried.  On exam, patient has some tenderness noted to proximal lateral aspect of her right lower extremity below the knee.  There is no joint involvement.  It appears patient has some varicose vein in the affected area.  She does not have any significant edema and no evidence of cellulitis or abscess noted.  Vascular ultrasound of right lower extremity independently viewed and interpreted by me which shows no evidence of DVT.  Agree with radiologist interpretation.  I recommend cool compress.  I will give patient outpatient follow-up with vascular surgery as needed for her varicose vein.  I offered Tylenol for pain medication but  patient declined.  She is able to ambulate and is stable for discharge.        Final Clinical Impression(s) / ED Diagnoses Final diagnoses:  Right leg pain  Varicose veins of right lower extremity with pain    Rx / DC Orders ED Discharge Orders     None         Fayrene Helper, PA-C 06/14/23 2026    Glyn Ade, MD 06/14/23 [phone]

## 2023-06-18 ENCOUNTER — Other Ambulatory Visit: Payer: Self-pay | Admitting: Physician Assistant

## 2023-06-18 DIAGNOSIS — Z1231 Encounter for screening mammogram for malignant neoplasm of breast: Secondary | ICD-10-CM

## 2023-06-25 ENCOUNTER — Ambulatory Visit
Admission: RE | Admit: 2023-06-25 | Discharge: 2023-06-25 | Disposition: A | Discharge status: Home / Self Care | Source: Ambulatory Visit | Attending: Physician Assistant | Admitting: Physician Assistant

## 2023-06-25 DIAGNOSIS — E669 Obesity, unspecified: Secondary | ICD-10-CM | POA: Diagnosis not present

## 2023-06-25 DIAGNOSIS — Z1231 Encounter for screening mammogram for malignant neoplasm of breast: Secondary | ICD-10-CM | POA: Diagnosis not present

## 2023-06-25 DIAGNOSIS — G4733 Obstructive sleep apnea (adult) (pediatric): Secondary | ICD-10-CM | POA: Diagnosis not present

## 2023-07-06 ENCOUNTER — Other Ambulatory Visit: Payer: Self-pay | Admitting: *Deleted

## 2023-07-06 DIAGNOSIS — M542 Cervicalgia: Secondary | ICD-10-CM | POA: Diagnosis not present

## 2023-07-06 DIAGNOSIS — I83813 Varicose veins of bilateral lower extremities with pain: Secondary | ICD-10-CM

## 2023-07-10 DIAGNOSIS — G4733 Obstructive sleep apnea (adult) (pediatric): Secondary | ICD-10-CM | POA: Diagnosis not present

## 2023-07-16 ENCOUNTER — Ambulatory Visit (HOSPITAL_COMMUNITY)
Admission: RE | Admit: 2023-07-16 | Discharge: 2023-07-16 | Disposition: A | Discharge status: Home / Self Care | Source: Ambulatory Visit | Attending: Surgery | Admitting: Surgery

## 2023-07-16 DIAGNOSIS — I83813 Varicose veins of bilateral lower extremities with pain: Secondary | ICD-10-CM | POA: Diagnosis not present

## 2023-07-17 ENCOUNTER — Emergency Department (HOSPITAL_COMMUNITY)
Admission: EM | Admit: 2023-07-17 | Discharge: 2023-07-17 | Disposition: A | Discharge status: Home / Self Care | Attending: Emergency Medicine | Admitting: Emergency Medicine

## 2023-07-17 ENCOUNTER — Other Ambulatory Visit: Payer: Self-pay

## 2023-07-17 DIAGNOSIS — M5412 Radiculopathy, cervical region: Secondary | ICD-10-CM | POA: Insufficient documentation

## 2023-07-17 DIAGNOSIS — M542 Cervicalgia: Secondary | ICD-10-CM | POA: Diagnosis present

## 2023-07-17 MED ORDER — KETOROLAC TROMETHAMINE 15 MG/ML IJ SOLN
15.0000 mg | Freq: Once | INTRAMUSCULAR | Status: AC
  Administered 2023-07-17: 15 mg via INTRAMUSCULAR
  Filled 2023-07-17: qty 1

## 2023-07-17 MED ORDER — CELECOXIB 200 MG PO CAPS: 200.0000 mg | ORAL_CAPSULE | Freq: Two times a day (BID) | ORAL | 0 refills | Status: AC

## 2023-07-17 MED ORDER — OXYCODONE HCL 5 MG PO TABS
10.0000 mg | ORAL_TABLET | Freq: Once | ORAL | Status: AC
  Administered 2023-07-17: 10 mg via ORAL
  Filled 2023-07-17: qty 2

## 2023-07-17 MED ORDER — ACETAMINOPHEN 500 MG PO TABS
1000.0000 mg | ORAL_TABLET | Freq: Once | ORAL | Status: AC
  Administered 2023-07-17: 1000 mg via ORAL
  Filled 2023-07-17: qty 2

## 2023-07-17 MED ORDER — ACETAMINOPHEN 500 MG PO TABS: 500.0000 mg | ORAL_TABLET | Freq: Four times a day (QID) | ORAL | 0 refills | Status: AC | PRN

## 2023-07-17 MED ORDER — OXYCODONE HCL 5 MG PO TABS: 5.0000 mg | ORAL_TABLET | Freq: Four times a day (QID) | ORAL | 0 refills | Status: AC | PRN

## 2023-07-17 NOTE — ED Triage Notes (Signed)
 POV. Complains of neck pain and shoulder pain on left side.   Hx chronic neck shoulder pain.  Alert on triage, pt describe as stiff.Pain 10 out of 10.    Pt saw primary doctor last week, muscle relaxer was prescribed, however, didn't provide any relief.

## 2023-07-17 NOTE — ED Provider Notes (Signed)
 [place] EMERGENCY DEPARTMENT AT [facility] Provider Note   CSN: [id number] Arrival date & time: 07/17/23  3875     History  Chief Complaint  Patient presents with   Neck Pain    Samantha Rice is a 58 y.o. female who presents to the emergency department with a chief complaint of left neck pain.  Patient states that this pain has been chronic and that she has been dealing with it for about 1 year.  Patient has seen primary care as well as specialist outpatient, but no diagnosis has been found.  Patient states she has not yet seen pain management and plans to see a chiropractor soon.  Patient states that her pain today is similar to her pain over the past year with her neck feeling stiff and sharp pains with movement, but states that her pain has progressed the last few days to be the worst it has ever been.  Patient describes the pain as a cramping tight constricting pain.  Patient has a past medical history significant for chronic neck/shoulder pain, bilateral low back pain without sciatica.  Patient had a MRI of her cervical spine without contrast completed on 05/01/2022, significant for lower lumbar spine predominant degenerative change with moderate to severe left-sided neural foraminal narrowing at C7-T1 and moderate at C5-C6 and C6/C7, no evidence of high-grade spinal canal stenosis or cord signal abnormality.  Denies trauma/injury today.   Neck Pain Associated symptoms: no chest pain, no fever and no weakness        Home Medications Prior to Admission medications   Medication Sig Start Date End Date Taking? Authorizing Provider  acetaminophen  (TYLENOL ) 500 MG tablet Take 1 tablet (500 mg total) by mouth every 6 (six) hours as needed. 07/17/23  Yes Onetha Bile, MD  celecoxib (CELEBREX) 200 MG capsule Take 1 capsule (200 mg total) by mouth 2 (two) times daily. 07/17/23  Yes Onetha Bile, MD  oxyCODONE (ROXICODONE) 5 MG immediate release tablet Take 1 tablet (5  mg total) by mouth every 6 (six) hours as needed for severe pain (pain score 7-10). 07/17/23  Yes Onetha Bile, MD  empagliflozin  (JARDIANCE ) 10 MG TABS tablet Take 1 tablet (10 mg total) by mouth daily before breakfast. 03/30/23   Marius Siemens, NP  metFORMIN  (GLUCOPHAGE ) 1000 MG tablet Take 1 tablet (1,000 mg total) by mouth 2 (two) times daily with a meal. 03/30/23   Marius Siemens, NP  triamcinolone  cream (KENALOG ) 0.5 % APPLY TOPICALLY 3 TIMES DAILY AS NEEDED. 10/12/22   Marius Siemens, NP  valACYclovir  (VALTREX ) 500 MG tablet Take 1 tablet (500 mg total) by mouth daily as needed (for outbreaks). 10/18/22   Marius Siemens, NP  valsartan -hydrochlorothiazide  (DIOVAN -HCT) 80-12.5 MG tablet Take 1 tablet by mouth daily. 03/30/23   Marius Siemens, NP      Allergies    Naproxen    Review of Systems   Review of Systems  Constitutional:  Positive for activity change. Negative for chills and fever.  Respiratory:  Negative for chest tightness and shortness of breath.   Cardiovascular:  Negative for chest pain.  Gastrointestinal:  Negative for abdominal pain, diarrhea, nausea and vomiting.  Musculoskeletal:  Positive for neck pain and neck stiffness. Negative for back pain.  Skin:  Negative for rash and wound.  Neurological:  Negative for weakness.    Physical Exam Updated Vital Signs BP (!) 171/101 (BP Location: Right Arm)   Pulse 82   Temp 99 F (37.2  C) (Oral)   Resp 19   LMP 03/14/2014   SpO2 100%  Physical Exam Vitals and nursing note reviewed.  Constitutional:      General: She is awake. She is not in acute distress. HENT:     Head: Normocephalic and atraumatic.  Eyes:     Extraocular Movements: Extraocular movements intact.  Neck:     Comments: Patient has reduced active neck ROM worse on L side. Patient states that she experiences a sharp constricting pain with movement. Painful with palpation.  Cardiovascular:     Rate and Rhythm: Normal rate and  regular rhythm.  Pulmonary:     Effort: Pulmonary effort is normal. No respiratory distress.     Breath sounds: Normal breath sounds. No wheezing, rhonchi or rales.  Abdominal:     General: Abdomen is flat.     Palpations: Abdomen is soft.  Musculoskeletal:     Cervical back: Tenderness present. Pain with movement and muscular tenderness present.  Skin:    General: Skin is warm and dry.     Capillary Refill: Capillary refill takes less than 2 seconds.  Neurological:     General: No focal deficit present.     Mental Status: She is alert and oriented to person, place, and time.     Motor: No weakness.     Coordination: Coordination normal.  Psychiatric:        Mood and Affect: Mood normal.        Behavior: Behavior normal. Behavior is cooperative.     ED Results / Procedures / Treatments   Labs (all labs ordered are listed, but only abnormal results are displayed) Labs Reviewed - No data to display  EKG None  Radiology VAS US  LOWER EXTREMITY VENOUS REFLUX Result Date: 07/16/2023  Lower Venous Reflux Study Patient Name:  Samantha Rice  Date of Exam:   07/16/2023 Medical Rec #: [id number]       Accession #:    [id number] Date of Birth: Dec 07, 1965      Patient Gender: F Patient Age:   19 years Exam Location:  [address] Procedure:      VAS US  LOWER EXTREMITY VENOUS REFLUX Referring Phys: [facility] --------------------------------------------------------------------------------  Indications: Varicosities, and Edema.  Risk Factors: None identified. Comparison Study: None. Performing Technologist: Estanislao Heimlich  Examination Guidelines: A complete evaluation includes B-mode imaging, spectral Doppler, color Doppler, and power Doppler as needed of all accessible portions of each vessel. Bilateral testing is considered an integral part of a complete examination. Limited examinations for reoccurring indications may be performed as noted. The reflux portion of the exam is performed with  the patient in reverse Trendelenburg. Significant venous reflux is defined as >500 ms in the superficial venous system, and >1 second in the deep venous system.  Venous Reflux Times +--------------+---------+------+-----------+------------+--------+ RIGHT         Reflux NoRefluxReflux TimeDiameter cmsComments                         Yes                                  +--------------+---------+------+-----------+------------+--------+ CFV                     yes   >1 second                      +--------------+---------+------+-----------+------------+--------+  FV mid        no                                             +--------------+---------+------+-----------+------------+--------+ Popliteal     no                                             +--------------+---------+------+-----------+------------+--------+ GSV at SFJ              yes    >500 ms      1.05             +--------------+---------+------+-----------+------------+--------+ GSV prox thighno                            0.35             +--------------+---------+------+-----------+------------+--------+ GSV mid thigh no                            0.31             +--------------+---------+------+-----------+------------+--------+ GSV dist thighno                            0.27             +--------------+---------+------+-----------+------------+--------+ GSV at knee   no                            0.22             +--------------+---------+------+-----------+------------+--------+ GSV prox calf no                            0.19             +--------------+---------+------+-----------+------------+--------+ SSV Pop Fossa no                            0.34             +--------------+---------+------+-----------+------------+--------+ SSV prox calf no                            0.43             +--------------+---------+------+-----------+------------+--------+   +--------------+---------+------+-----------+------------+--------+ LEFT          Reflux NoRefluxReflux TimeDiameter cmsComments                         Yes                                  +--------------+---------+------+-----------+------------+--------+ CFV                     yes   >1 second                      +--------------+---------+------+-----------+------------+--------+ FV mid        no                                             +--------------+---------+------+-----------+------------+--------+  Popliteal     no                                             +--------------+---------+------+-----------+------------+--------+ GSV at [facility]    no                            0.97             +--------------+---------+------+-----------+------------+--------+ GSV prox thighno                            0.34             +--------------+---------+------+-----------+------------+--------+ GSV mid thigh no                            0.35             +--------------+---------+------+-----------+------------+--------+ GSV dist thighno                            0.41             +--------------+---------+------+-----------+------------+--------+ GSV at knee   no                            0.24             +--------------+---------+------+-----------+------------+--------+ GSV prox calf no                            0.23             +--------------+---------+------+-----------+------------+--------+ SSV Pop Fossa no                            0.37             +--------------+---------+------+-----------+------------+--------+ SSV prox calf no                            0.32             +--------------+---------+------+-----------+------------+--------+   Summary: Bilateral: - No evidence of deep vein thrombosis seen in the lower extremities, bilaterally, from the common femoral through the popliteal veins.  Right: - No evidence  of deep vein thrombosis seen in the right lower extremity, from the common femoral through the popliteal veins. - No evidence of superficial venous thrombosis in the right lower extremity. - No evidence of superficial venous reflux seen in the right short saphenous vein. - Venous reflux is noted in the right common femoral vein. - Venous reflux is noted in the right sapheno-femoral junction.  Left: - No evidence of superficial venous thrombosis in the left lower extremity. - No evidence of superficial venous reflux seen in the left greater saphenous vein. - No evidence of superficial venous reflux seen in the left short saphenous vein. - Venous reflux is noted in the left common femoral vein.  *See table(s) above for measurements and observations. Electronically signed by Genny Kid MD on 07/16/2023 at 3:40:22 PM.    Final     Procedures Procedures    Medications Ordered in ED Medications  oxyCODONE (Oxy IR/ROXICODONE) immediate release tablet 10 mg (10 mg Oral Given 07/17/23 0548)  acetaminophen  (TYLENOL ) tablet 1,000 mg (1,000 mg Oral Given 07/17/23 0548)  ketorolac (TORADOL) 15 MG/ML injection 15 mg (15 mg Intramuscular Given 07/17/23 0549)    ED Course/ Medical Decision Making/ A&P    Patient presents to the ED for concern of neck pain, this involves an extensive number of treatment options, and is a complaint that carries with it a high risk of complications and morbidity.  The differential diagnosis includes spinal stenosis, cervical radiculopathy, trauma/injury, muscular pathology, etc.   Co morbidities that complicate the patient evaluation  Chronic neck pain   Additional history obtained:  Additional history obtained from Outside Medical Records   External records from outside source obtained and reviewed including MRI of 2024   Medicines ordered and prescription drug management:  I ordered medication including Tylenol , oxycodone, Toradol for pain Reevaluation of the  patient after these medicines showed that the patient stayed the same I have reviewed the patients home medicines and have made adjustments as needed   Test Considered:  Imaging of cervical spine: patient denies injury, patient states symptoms are consistent with chronic neck pain complaint, previous MRI completed approximately 1 year ago, patient has been following up with PCP as well as specialist   Critical Interventions:  None   Problem List / ED Course:  Neck pain Based on chart review patient has a history of chronic neck pain, MRI completed approximately 1 year ago, patient has followed up with primary care and specialist with no diagnosis found Patient states that for the last few days her pain is just become worse, seeking help with pain control today based off patient conversation Denies changes in sensation, motor weakness, bowel/bladder incontinence, nausea, vomiting, diarrhea Pain control attempted today in the emergency department including Toradol injection, oxycodone, acetaminophen -minimal relief with pain meds Patient seen and conjunction with attending who recommended outpatient pain control and close follow-up with primary care and specialist Attending recommends discharge Return precautions given Patient discharged   Reevaluation:  After the interventions noted above, I reevaluated the patient and found that they have :stayed the same   Social Determinants of Health:  none   Dispostion:  After consideration of the diagnostic results and the patients response to treatment, I feel that the patient would benefit from urgent follow-up with PCP and specialists.  Click here for ABCD2, HEART and other calculatorsREFRESH Note before signing :1}                              Medical Decision Making Risk OTC drugs. Prescription drug management.          Final Clinical Impression(s) / ED Diagnoses Final diagnoses:  Neck pain  Cervical radiculopathy     Rx / DC Orders ED Discharge Orders          Ordered    oxyCODONE (ROXICODONE) 5 MG immediate release tablet  Every 6 hours PRN        07/17/23 0628    acetaminophen  (TYLENOL ) 500 MG tablet  Every 6 hours PRN        07/17/23 0628    celecoxib (CELEBREX) 200 MG capsule  2 times daily        07/17/23 0628              Detrick Dani F, PA-C 07/17/23 1610    Onetha Bile, MD 07/18/23 [phone]

## 2023-07-17 NOTE — Discharge Instructions (Addendum)
 It was a pleasure to take care of you today.  Today we evaluated the causes of your left-sided neck pain as well as treated your pain. You have been prescribe outpatient pain meds, please take as prescribed. Please follow-up with your PCP if symptoms persist, recommend follow-up within 48 hours. Please turn to the emergency department or seek further medical care if you experience any of the following symptoms including but not limited to severe pain, bowel/bladder incontinence, unexplained weakness, dizziness, confusion, severe back pain.

## 2023-07-31 ENCOUNTER — Ambulatory Visit: Attending: Vascular Surgery | Admitting: Physician Assistant

## 2023-07-31 VITALS — BP 152/85 | HR 76 | Temp 99.0°F | Resp 18 | Ht 61.0 in | Wt 192.4 lb

## 2023-07-31 DIAGNOSIS — I872 Venous insufficiency (chronic) (peripheral): Secondary | ICD-10-CM | POA: Insufficient documentation

## 2023-07-31 DIAGNOSIS — M9911 Subluxation complex (vertebral) of cervical region: Secondary | ICD-10-CM | POA: Diagnosis not present

## 2023-07-31 DIAGNOSIS — I83899 Varicose veins of unspecified lower extremities with other complications: Secondary | ICD-10-CM | POA: Insufficient documentation

## 2023-07-31 DIAGNOSIS — M9912 Subluxation complex (vertebral) of thoracic region: Secondary | ICD-10-CM | POA: Diagnosis not present

## 2023-07-31 NOTE — Progress Notes (Signed)
 Requested by:  Onetha Bile, MD [address] [place],  [place] [address]  Reason for consultation: painful spider veins    History of Present Illness   Samantha Rice is a 58 y.o. (1965-03-16) female who presents for evaluation of painful spider veins.  She went to the emergency room on 06/14/2023 with sudden onset right lateral lower leg throbbing pain.  DVT study in the emergency room was negative.   The patient states the day she went to the emergency room she had new onset right lateral lower leg pain.  This was the same area that she has some spider veins.  This area has never caused her any pain or tenderness before.  Her pain went away after a few days.  She denies any lower extremity swelling, achiness, or heaviness.  She denies any prior history of DVT or previous vein procedures.  She does not wear compression stockings or elevate her legs.  Past Medical History:  Diagnosis Date   Diabetes mellitus without complication (HCC)    Hypertension     Past Surgical History:  Procedure Laterality Date   c cection      Social History   Socioeconomic History   Marital status: Married    Spouse name: Not on file   Number of children: Not on file   Years of education: Not on file   Highest education level: Master's degree (e.g., MA, MS, MEng, MEd, MSW, MBA)  Occupational History   Not on file  Tobacco Use   Smoking status: Every Day    Current packs/day: 0.50    Types: Cigarettes   Smokeless tobacco: Never  Vaping Use   Vaping status: Never Used  Substance and Sexual Activity   Alcohol use: No   Drug use: No   Sexual activity: Yes  Other Topics Concern   Not on file  Social History Narrative   Not on file   Social Drivers of Health   Financial Resource Strain: Medium Risk (07/12/2022)   Overall Financial Resource Strain (CARDIA)    Difficulty of Paying Living Expenses: Somewhat hard  Food Insecurity: Food Insecurity Present (03/30/2023)   Hunger Vital Sign     Worried About Running Out of Food in the Last Year: Sometimes true    Ran Out of Food in the Last Year: Sometimes true  Transportation Needs: No Transportation Needs (03/30/2023)   PRAPARE - Administrator, Civil Service (Medical): No    Lack of Transportation (Non-Medical): No  Physical Activity: Insufficiently Active (07/12/2022)   Exercise Vital Sign    Days of Exercise per Week: 4 days    Minutes of Exercise per Session: 30 min  Stress: No Stress Concern Present (07/12/2022)   [employer] of Occupational Health - Occupational Stress Questionnaire    Feeling of Stress : Only a little  Social Connections: Unknown (03/30/2023)   Social Connection and Isolation Panel [NHANES]    Frequency of Communication with Friends and Family: More than three times a week    Frequency of Social Gatherings with Friends and Family: More than three times a week    Attends Religious Services: Not on file    Active Member of Clubs or Organizations: Not on file    Attends Banker Meetings: Not on file    Marital Status: Not on file  Intimate Partner Violence: Not At Risk (03/30/2023)   Humiliation, Afraid, Rape, and Kick questionnaire    Fear of Current or Ex-Partner: No  Emotionally Abused: No    Physically Abused: No    Sexually Abused: No    Family History  Problem Relation Age of Onset   Dementia Mother    Diabetes Father    Crohn's disease Sister    Diabetes Brother    Diabetes Paternal Grandmother    Colon cancer Neg Hx    Colon polyps Neg Hx    Esophageal cancer Neg Hx    Rectal cancer Neg Hx    Stomach cancer Neg Hx     Current Outpatient Medications  Medication Sig Dispense Refill   acetaminophen  (TYLENOL ) 500 MG tablet Take 1 tablet (500 mg total) by mouth every 6 (six) hours as needed. 30 tablet 0   celecoxib  (CELEBREX ) 200 MG capsule Take 1 capsule (200 mg total) by mouth 2 (two) times daily. 20 capsule 0   empagliflozin  (JARDIANCE ) 10 MG TABS  tablet Take 1 tablet (10 mg total) by mouth daily before breakfast. 90 tablet 1   metFORMIN  (GLUCOPHAGE ) 1000 MG tablet Take 1 tablet (1,000 mg total) by mouth 2 (two) times daily with a meal. 180 tablet 1   oxyCODONE  (ROXICODONE ) 5 MG immediate release tablet Take 1 tablet (5 mg total) by mouth every 6 (six) hours as needed for severe pain (pain score 7-10). 20 tablet 0   triamcinolone  cream (KENALOG ) 0.5 % APPLY TOPICALLY 3 TIMES DAILY AS NEEDED. 453 g 1   valACYclovir  (VALTREX ) 500 MG tablet Take 1 tablet (500 mg total) by mouth daily as needed (for outbreaks). 30 tablet 0   valsartan -hydrochlorothiazide  (DIOVAN -HCT) 80-12.5 MG tablet Take 1 tablet by mouth daily. 90 tablet 1   Current Facility-Administered Medications  Medication Dose Route Frequency Provider Last Rate Last Admin   0.9 %  sodium chloride  infusion  500 mL Intravenous Once Janel Medford, MD        Allergies  Allergen Reactions   Naproxen     Per patient caused facial swelling.    REVIEW OF SYSTEMS (negative unless checked):   Cardiac:  []  Chest pain or chest pressure? []  Shortness of breath upon activity? []  Shortness of breath when lying flat? []  Irregular heart rhythm?  Vascular:  []  Pain in calf, thigh, or hip brought on by walking? []  Pain in feet at night that wakes you up from your sleep? []  Blood clot in your veins? []  Leg swelling?  Pulmonary:  []  Oxygen at home? []  Productive cough? []  Wheezing?  Neurologic:  []  Sudden weakness in arms or legs? []  Sudden numbness in arms or legs? []  Sudden onset of difficult speaking or slurred speech? []  Temporary loss of vision in one eye? []  Problems with dizziness?  Gastrointestinal:  []  Blood in stool? []  Vomited blood?  Genitourinary:  []  Burning when urinating? []  Blood in urine?  Psychiatric:  []  Major depression  Hematologic:  []  Bleeding problems? []  Problems with blood clotting?  Dermatologic:  []  Rashes or  ulcers?  Constitutional:  []  Fever or chills?  Ear/Nose/Throat:  []  Change in hearing? []  Nose bleeds? []  Sore throat?  Musculoskeletal:  []  Back pain? []  Joint pain? []  Muscle pain?   Physical Examination     Vitals:   07/31/23 0926  BP: (!) 152/85  Pulse: 76  Resp: 18  Temp: 99 F (37.2 C)  TempSrc: Temporal  SpO2: 98%  Weight: 192 lb 6.4 oz (87.3 kg)  Height: 5\' 1"  (1.549 m)   Body mass index is 36.35 kg/m.  General:  WDWN in NAD; vital  signs documented above Gait: Not observed HENT: WNL, normocephalic Pulmonary: normal non-labored breathing  Cardiac: regular Abdomen: soft, NT, no masses Skin: without rashes Vascular Exam/Pulses: BLE warm and well perfused Extremities: without varicose veins, with reticular veins, without edema, without stasis pigmentation, without lipodermatosclerosis, without ulcers Musculoskeletal: no muscle wasting or atrophy  Neurologic: A&O X 3;  No focal weakness or paresthesias are detected Psychiatric:  The pt has Normal affect.  Non-invasive Vascular Imaging   BLE Venous Insufficiency Duplex (07/16/2023):  +--------------+---------+------+-----------+------------+--------+  RIGHT        Reflux NoRefluxReflux TimeDiameter cmsComments                          Yes                                   +--------------+---------+------+-----------+------------+--------+  CFV                    yes   >1 second                       +--------------+---------+------+-----------+------------+--------+  FV mid        no                                              +--------------+---------+------+-----------+------------+--------+  Popliteal    no                                              +--------------+---------+------+-----------+------------+--------+  GSV at SFJ              yes    >500 ms      1.05              +--------------+---------+------+-----------+------------+--------+  GSV prox  thighno                            0.35              +--------------+---------+------+-----------+------------+--------+  GSV mid thigh no                            0.31              +--------------+---------+------+-----------+------------+--------+  GSV dist thighno                            0.27              +--------------+---------+------+-----------+------------+--------+  GSV at knee   no                            0.22              +--------------+---------+------+-----------+------------+--------+  GSV prox calf no                            0.19              +--------------+---------+------+-----------+------------+--------+  SSV Pop  Fossa no                            0.34              +--------------+---------+------+-----------+------------+--------+  SSV prox calf no                            0.43              +--------------+---------+------+-----------+------------+--------+     +--------------+---------+------+-----------+------------+--------+  LEFT         Reflux NoRefluxReflux TimeDiameter cmsComments                          Yes                                   +--------------+---------+------+-----------+------------+--------+  CFV                    yes   >1 second                       +--------------+---------+------+-----------+------------+--------+  FV mid        no                                              +--------------+---------+------+-----------+------------+--------+  Popliteal    no                                              +--------------+---------+------+-----------+------------+--------+  GSV at [facility]    no                            0.97              +--------------+---------+------+-----------+------------+--------+  GSV prox thighno                            0.34              +--------------+---------+------+-----------+------------+--------+   GSV mid thigh no                            0.35              +--------------+---------+------+-----------+------------+--------+  GSV dist thighno                            0.41              +--------------+---------+------+-----------+------------+--------+  GSV at knee   no                            0.24              +--------------+---------+------+-----------+------------+--------+  GSV prox calf no  0.23              +--------------+---------+------+-----------+------------+--------+  SSV Pop Fossa no                            0.37              +--------------+---------+------+-----------+------------+--------+  SSV prox calf no                            0.32              +--------------+---------+------+-----------+------------+--------+     Medical Decision Making   Derrisha Foos is a 58 y.o. female who presents for evaluation of painful spider veins  Based on the patient's duplex, there is reflux in the right common femoral vein and greater saphenous vein at the saphenofemoral junction.  There is also reflux in the left common femoral vein.  The remainder of the patient's deep and superficial venous system is competent.  There is no evidence of DVT on exam.  She would not be a candidate for saphenous vein ablation She reports an episode of new onset throbbing pain in her right lateral calf about 2 months ago. She was evaluated at the ED with a negative DVT study At today's visit, she says that her pain has gone away.  She denies any lower extremity swelling.  She denies any previous history of painful spider or reticular veins. On exam she has no lower extremity edema or varicose veins.  She does have some spider veins on her right lateral calf where she had her throbbing pain.  I have explained to the patient that it is common for spider and/or reticular veins to cause throbbing/burning/itching.  Her symptoms  should be well-controlled with conservative therapy including exercise, compression stockings, and leg elevation  I have offered the patient sclerotherapy in the future if her symptoms are not controlled with conservative therapy. She can follow up with our office as needed   Ron Cobbs, PA-C [facility] Office: [phone]  07/31/2023, 9:55 AM  Clinic MD: Edgardo Goodwill

## 2023-08-06 DIAGNOSIS — M9911 Subluxation complex (vertebral) of cervical region: Secondary | ICD-10-CM | POA: Diagnosis not present

## 2023-08-06 DIAGNOSIS — M9912 Subluxation complex (vertebral) of thoracic region: Secondary | ICD-10-CM | POA: Diagnosis not present

## 2023-08-09 DIAGNOSIS — M9912 Subluxation complex (vertebral) of thoracic region: Secondary | ICD-10-CM | POA: Diagnosis not present

## 2023-08-09 DIAGNOSIS — M9911 Subluxation complex (vertebral) of cervical region: Secondary | ICD-10-CM | POA: Diagnosis not present

## 2023-08-13 DIAGNOSIS — M9911 Subluxation complex (vertebral) of cervical region: Secondary | ICD-10-CM | POA: Diagnosis not present

## 2023-08-13 DIAGNOSIS — M9912 Subluxation complex (vertebral) of thoracic region: Secondary | ICD-10-CM | POA: Diagnosis not present

## 2023-08-15 DIAGNOSIS — M9912 Subluxation complex (vertebral) of thoracic region: Secondary | ICD-10-CM | POA: Diagnosis not present

## 2023-08-15 DIAGNOSIS — M9911 Subluxation complex (vertebral) of cervical region: Secondary | ICD-10-CM | POA: Diagnosis not present

## 2023-08-23 DIAGNOSIS — I10 Essential (primary) hypertension: Secondary | ICD-10-CM | POA: Diagnosis not present

## 2023-08-23 DIAGNOSIS — Z9189 Other specified personal risk factors, not elsewhere classified: Secondary | ICD-10-CM | POA: Diagnosis not present

## 2023-08-23 DIAGNOSIS — E119 Type 2 diabetes mellitus without complications: Secondary | ICD-10-CM | POA: Diagnosis not present

## 2023-08-23 DIAGNOSIS — F17200 Nicotine dependence, unspecified, uncomplicated: Secondary | ICD-10-CM | POA: Diagnosis not present

## 2023-08-23 DIAGNOSIS — E782 Mixed hyperlipidemia: Secondary | ICD-10-CM | POA: Diagnosis not present

## 2023-09-09 DIAGNOSIS — G4733 Obstructive sleep apnea (adult) (pediatric): Secondary | ICD-10-CM | POA: Diagnosis not present

## 2023-09-27 DIAGNOSIS — F17200 Nicotine dependence, unspecified, uncomplicated: Secondary | ICD-10-CM | POA: Diagnosis not present

## 2023-09-27 DIAGNOSIS — E782 Mixed hyperlipidemia: Secondary | ICD-10-CM | POA: Diagnosis not present

## 2023-09-27 DIAGNOSIS — E119 Type 2 diabetes mellitus without complications: Secondary | ICD-10-CM | POA: Diagnosis not present

## 2023-09-27 DIAGNOSIS — I10 Essential (primary) hypertension: Secondary | ICD-10-CM | POA: Diagnosis not present

## 2023-09-27 DIAGNOSIS — Z9189 Other specified personal risk factors, not elsewhere classified: Secondary | ICD-10-CM | POA: Diagnosis not present

## 2023-09-27 DIAGNOSIS — R590 Localized enlarged lymph nodes: Secondary | ICD-10-CM | POA: Diagnosis not present

## 2023-10-01 DIAGNOSIS — E782 Mixed hyperlipidemia: Secondary | ICD-10-CM | POA: Diagnosis not present

## 2023-10-01 DIAGNOSIS — W57XXXA Bitten or stung by nonvenomous insect and other nonvenomous arthropods, initial encounter: Secondary | ICD-10-CM | POA: Diagnosis not present

## 2023-10-01 DIAGNOSIS — Z9189 Other specified personal risk factors, not elsewhere classified: Secondary | ICD-10-CM | POA: Diagnosis not present

## 2023-10-01 DIAGNOSIS — E119 Type 2 diabetes mellitus without complications: Secondary | ICD-10-CM | POA: Diagnosis not present

## 2023-10-01 DIAGNOSIS — R590 Localized enlarged lymph nodes: Secondary | ICD-10-CM | POA: Diagnosis not present

## 2023-10-01 DIAGNOSIS — F17200 Nicotine dependence, unspecified, uncomplicated: Secondary | ICD-10-CM | POA: Diagnosis not present

## 2023-10-01 DIAGNOSIS — I10 Essential (primary) hypertension: Secondary | ICD-10-CM | POA: Diagnosis not present

## 2023-10-04 ENCOUNTER — Other Ambulatory Visit: Payer: Self-pay

## 2023-10-04 ENCOUNTER — Emergency Department (HOSPITAL_COMMUNITY)
Admission: EM | Admit: 2023-10-04 | Discharge: 2023-10-04 | Disposition: A | Discharge status: Home / Self Care | Source: Intra-hospital | Attending: Emergency Medicine | Admitting: Emergency Medicine

## 2023-10-04 ENCOUNTER — Encounter (HOSPITAL_COMMUNITY): Payer: Self-pay | Admitting: Emergency Medicine

## 2023-10-04 DIAGNOSIS — S50861A Insect bite (nonvenomous) of right forearm, initial encounter: Secondary | ICD-10-CM | POA: Diagnosis not present

## 2023-10-04 DIAGNOSIS — Z7984 Long term (current) use of oral hypoglycemic drugs: Secondary | ICD-10-CM | POA: Insufficient documentation

## 2023-10-04 DIAGNOSIS — Z79899 Other long term (current) drug therapy: Secondary | ICD-10-CM | POA: Diagnosis not present

## 2023-10-04 DIAGNOSIS — W57XXXA Bitten or stung by nonvenomous insect and other nonvenomous arthropods, initial encounter: Secondary | ICD-10-CM | POA: Diagnosis not present

## 2023-10-04 DIAGNOSIS — S40861A Insect bite (nonvenomous) of right upper arm, initial encounter: Secondary | ICD-10-CM | POA: Insufficient documentation

## 2023-10-04 DIAGNOSIS — E119 Type 2 diabetes mellitus without complications: Secondary | ICD-10-CM | POA: Diagnosis not present

## 2023-10-04 DIAGNOSIS — I10 Essential (primary) hypertension: Secondary | ICD-10-CM | POA: Insufficient documentation

## 2023-10-04 MED ORDER — DOXYCYCLINE HYCLATE 100 MG PO CAPS: 100.0000 mg | ORAL_CAPSULE | Freq: Two times a day (BID) | ORAL | 0 refills | Status: AC

## 2023-10-04 MED ORDER — DEXAMETHASONE 4 MG PO TABS
10.0000 mg | ORAL_TABLET | Freq: Once | ORAL | Status: AC
  Administered 2023-10-04: 10 mg via ORAL
  Filled 2023-10-04: qty 1

## 2023-10-04 MED ORDER — DOXYCYCLINE HYCLATE 100 MG PO TABS
100.0000 mg | ORAL_TABLET | Freq: Once | ORAL | Status: AC
  Administered 2023-10-04: 100 mg via ORAL
  Filled 2023-10-04: qty 1

## 2023-10-04 NOTE — ED Provider Notes (Signed)
 [place] EMERGENCY DEPARTMENT AT [facility] Provider Note   CSN: [id number] Arrival date & time: 10/04/23  9463     Patient presents with: Mosquito Bites   Samantha Rice is a 58 y.o. female.   The history is provided by the patient.   She has history of hypertension, diabetes comes in because of insect bites on her arms.  She was working in her yard 5 days ago and noted bites on both arms.  A similar event had occurred several weeks ago requiring a course of steroids and antibiotics.  Today, she noted a bite in the proximal part of her right upper arm was more swollen and had some white drainage.  She denies fever or chills.  As a separate complaint, she has a sensation in her throat like there are air bubbles there.  This does not cause any difficulty with breathing or swallowing.    Prior to Admission medications   Medication Sig Start Date End Date Taking? Authorizing Provider  acetaminophen  (TYLENOL ) 500 MG tablet Take 1 tablet (500 mg total) by mouth every 6 (six) hours as needed. 07/17/23   Jerral Meth, MD  celecoxib  (CELEBREX ) 200 MG capsule Take 1 capsule (200 mg total) by mouth 2 (two) times daily. 07/17/23   Jerral Meth, MD  empagliflozin  (JARDIANCE ) 10 MG TABS tablet Take 1 tablet (10 mg total) by mouth daily before breakfast. 03/30/23   Celestia Rosaline SQUIBB, NP  metFORMIN  (GLUCOPHAGE ) 1000 MG tablet Take 1 tablet (1,000 mg total) by mouth 2 (two) times daily with a meal. 03/30/23   Celestia Rosaline SQUIBB, NP  oxyCODONE  (ROXICODONE ) 5 MG immediate release tablet Take 1 tablet (5 mg total) by mouth every 6 (six) hours as needed for severe pain (pain score 7-10). 07/17/23   Jerral Meth, MD  triamcinolone  cream (KENALOG ) 0.5 % APPLY TOPICALLY 3 TIMES DAILY AS NEEDED. 10/12/22   Celestia Rosaline SQUIBB, NP  valACYclovir  (VALTREX ) 500 MG tablet Take 1 tablet (500 mg total) by mouth daily as needed (for outbreaks). 10/18/22   Celestia Rosaline SQUIBB, NP   valsartan -hydrochlorothiazide  (DIOVAN -HCT) 80-12.5 MG tablet Take 1 tablet by mouth daily. 03/30/23   Celestia Rosaline SQUIBB, NP    Allergies: Naproxen    Review of Systems  All other systems reviewed and are negative.   Updated Vital Signs BP (!) 159/86   Pulse 81   Temp 98.1 F (36.7 C)   Resp 19   Wt 87.3 kg   LMP 03/14/2014   SpO2 100%   BMI 36.37 kg/m   Physical Exam Vitals and nursing note reviewed.   58 year old female, resting comfortably and in no acute distress. Vital signs are significant for elevated blood pressure. Oxygen saturation is 100%, which is normal. Head is normocephalic and atraumatic. PERRLA, EOMI. Oropharynx is clear. Neck is nontender and supple without adenopathy.  There is no crepitus and no stridor. Lungs are clear without rales, wheezes, or rhonchi. Heart has regular rate and rhythm without murmur. Extremities: There is several raised, erythematous areas on the arms consistent with arthropod bites.  There is minimal purulent drainage from a bite on the right upper arm near the axilla.  This bite appears to be a small pustule rather than an actual abscess.  No lesions warranting incision and drainage are present. Skin is warm and dry without other rash. Neurologic: Mental status is normal, moves all extremities equally.    Procedures   Medications Ordered in the ED  dexamethasone  (DECADRON ) tablet  10 mg (has no administration in time range)  doxycycline  (VIBRA -TABS) tablet 100 mg (has no administration in time range)                                    Medical Decision Making  Arthropod bites of both arms, 1 draining pustule on the right upper arm not requiring incision and drainage.  Sense of air bubbles in her throat of uncertain cause but no evidence of significant pathology on exam and I do not feel it warrants imaging at this point.  I have ordered a dose of dexamethasone  and doxycycline  and I am discharging her with a prescription for  doxycycline .  I have advised her to use warm compresses, use topical hydrocortisone cream.  Return precautions discussed.     Final diagnoses:  Insect bite of right upper arm, initial encounter    ED Discharge Orders          Ordered    doxycycline  (VIBRAMYCIN ) 100 MG capsule  2 times daily        10/04/23 0619               Raford Lenis, MD 10/04/23 [phone]

## 2023-10-04 NOTE — Discharge Instructions (Addendum)
 Apply warm compresses several times a day.  Apply hydrocortisone cream twice a day.  Return to the emergency department if any of the bites seem like they are getting larger and more painful.

## 2023-10-04 NOTE — ED Triage Notes (Signed)
 Pt in with mosquito bites present to arms and legs x 5 days. Pt states she went to her PCP and she was placed on abx due to the welts that she developed at each bite. Pt states she woke up with a red ring around a bite under R armpit, has pus coming out of it. Also states she has noticed a change in her breathing, states it feels like bubbles popping when I breathe. No acute distress noted at this time

## 2023-10-10 DIAGNOSIS — G4733 Obstructive sleep apnea (adult) (pediatric): Secondary | ICD-10-CM | POA: Diagnosis not present

## 2023-10-15 DIAGNOSIS — I10 Essential (primary) hypertension: Secondary | ICD-10-CM | POA: Diagnosis not present

## 2023-10-15 DIAGNOSIS — K0889 Other specified disorders of teeth and supporting structures: Secondary | ICD-10-CM | POA: Diagnosis not present

## 2023-10-15 DIAGNOSIS — E782 Mixed hyperlipidemia: Secondary | ICD-10-CM | POA: Diagnosis not present

## 2023-10-15 DIAGNOSIS — Z9189 Other specified personal risk factors, not elsewhere classified: Secondary | ICD-10-CM | POA: Diagnosis not present

## 2023-10-15 DIAGNOSIS — F17200 Nicotine dependence, unspecified, uncomplicated: Secondary | ICD-10-CM | POA: Diagnosis not present

## 2023-10-15 DIAGNOSIS — E119 Type 2 diabetes mellitus without complications: Secondary | ICD-10-CM | POA: Diagnosis not present

## 2023-10-22 ENCOUNTER — Other Ambulatory Visit: Payer: Self-pay | Admitting: Physician Assistant

## 2023-10-22 DIAGNOSIS — R590 Localized enlarged lymph nodes: Secondary | ICD-10-CM

## 2023-10-24 ENCOUNTER — Ambulatory Visit
Admission: RE | Admit: 2023-10-24 | Discharge: 2023-10-24 | Disposition: A | Discharge status: Home / Self Care | Source: Ambulatory Visit | Attending: Physician Assistant | Admitting: Physician Assistant

## 2023-10-24 DIAGNOSIS — R590 Localized enlarged lymph nodes: Secondary | ICD-10-CM

## 2023-10-31 DIAGNOSIS — I10 Essential (primary) hypertension: Secondary | ICD-10-CM | POA: Diagnosis not present

## 2023-11-02 ENCOUNTER — Telehealth (HOSPITAL_BASED_OUTPATIENT_CLINIC_OR_DEPARTMENT_OTHER): Payer: Self-pay

## 2023-11-02 ENCOUNTER — Encounter (HOSPITAL_BASED_OUTPATIENT_CLINIC_OR_DEPARTMENT_OTHER): Payer: Self-pay | Admitting: Physician Assistant

## 2023-11-09 ENCOUNTER — Other Ambulatory Visit: Payer: Self-pay | Admitting: Physician Assistant

## 2023-11-09 DIAGNOSIS — R590 Localized enlarged lymph nodes: Secondary | ICD-10-CM

## 2023-11-12 ENCOUNTER — Other Ambulatory Visit: Payer: Self-pay | Admitting: Physician Assistant

## 2023-11-12 ENCOUNTER — Encounter: Payer: Self-pay | Admitting: Physician Assistant

## 2023-11-12 DIAGNOSIS — R9389 Abnormal findings on diagnostic imaging of other specified body structures: Secondary | ICD-10-CM

## 2023-11-12 DIAGNOSIS — Z111 Encounter for screening for respiratory tuberculosis: Secondary | ICD-10-CM | POA: Diagnosis not present

## 2023-11-12 DIAGNOSIS — Z124 Encounter for screening for malignant neoplasm of cervix: Secondary | ICD-10-CM | POA: Diagnosis not present

## 2023-11-12 DIAGNOSIS — I10 Essential (primary) hypertension: Secondary | ICD-10-CM | POA: Diagnosis not present

## 2023-11-13 ENCOUNTER — Other Ambulatory Visit: Payer: Self-pay | Admitting: Physician Assistant

## 2023-11-13 ENCOUNTER — Encounter: Payer: Self-pay | Admitting: Physician Assistant

## 2023-11-13 DIAGNOSIS — R9389 Abnormal findings on diagnostic imaging of other specified body structures: Secondary | ICD-10-CM

## 2023-11-19 ENCOUNTER — Ambulatory Visit
Admission: RE | Admit: 2023-11-19 | Discharge: 2023-11-19 | Disposition: A | Discharge status: Home / Self Care | Source: Ambulatory Visit | Attending: Physician Assistant | Admitting: Physician Assistant

## 2023-11-19 DIAGNOSIS — R590 Localized enlarged lymph nodes: Secondary | ICD-10-CM | POA: Diagnosis not present

## 2023-11-19 DIAGNOSIS — R9389 Abnormal findings on diagnostic imaging of other specified body structures: Secondary | ICD-10-CM

## 2023-11-19 MED ORDER — IOPAMIDOL (ISOVUE-300) INJECTION 61%
80.0000 mL | Freq: Once | INTRAVENOUS | Status: AC | PRN
  Administered 2023-11-19: 80 mL via INTRAVENOUS

## 2023-11-30 ENCOUNTER — Other Ambulatory Visit (INDEPENDENT_AMBULATORY_CARE_PROVIDER_SITE_OTHER): Payer: Self-pay | Admitting: Primary Care

## 2023-11-30 DIAGNOSIS — I10 Essential (primary) hypertension: Secondary | ICD-10-CM

## 2023-12-26 ENCOUNTER — Other Ambulatory Visit (INDEPENDENT_AMBULATORY_CARE_PROVIDER_SITE_OTHER): Payer: Self-pay | Admitting: Primary Care

## 2023-12-26 DIAGNOSIS — I10 Essential (primary) hypertension: Secondary | ICD-10-CM

## 2023-12-27 NOTE — Telephone Encounter (Signed)
 Requested medication (s) are due for refill today: yes  Requested medication (s) are on the active medication list: yes  Last refill:  03/30/23  Future visit scheduled: yes  Notes to clinic:  Unable to refill per protocol due to failed labs, no updated results.      Requested Prescriptions  Pending Prescriptions Disp Refills   valsartan -hydrochlorothiazide  (DIOVAN -HCT) 80-12.5 MG tablet [Pharmacy Med Name: VALSARTAN -HCTZ 80-12.5 MG TAB] 90 tablet 1    Sig: TAKE 1 TABLET BY MOUTH EVERY DAY     Cardiovascular: ARB + Diuretic Combos Failed - 12/27/2023  4:13 PM      Failed - K in normal range and within 180 days    Potassium  Date Value Ref Range Status  03/15/2022 4.5 3.5 - 5.2 mmol/L Final         Failed - Na in normal range and within 180 days    Sodium  Date Value Ref Range Status  03/15/2022 141 134 - 144 mmol/L Final         Failed - Cr in normal range and within 180 days    Creatinine, Ser  Date Value Ref Range Status  03/15/2022 0.57 0.57 - 1.00 mg/dL Final         Failed - eGFR is 10 or above and within 180 days    GFR calc Af Amer  Date Value Ref Range Status  03/11/2019 >60 >60 mL/min Final   GFR, Estimated  Date Value Ref Range Status  04/16/2021 >60 >60 mL/min Final    Comment:    (NOTE) Calculated using the CKD-EPI Creatinine Equation (2021)    eGFR  Date Value Ref Range Status  03/15/2022 108 >59 mL/min/1.73 Final         Failed - Last BP in normal range    BP Readings from Last 1 Encounters:  10/04/23 (!) 143/86         Failed - Valid encounter within last 6 months    Recent Outpatient Visits           9 months ago Abnormal pigmentation   [place] [facility] Celestia Rosaline SQUIBB, NP   1 year ago Weight gain   [place] [facility] Celestia Rosaline SQUIBB, NP   1 year ago DM (diabetes mellitus) type 2, uncontrolled, with ketoacidosis (HCC)   [place] [facility] Celestia Rosaline SQUIBB, NP    1 year ago Cervical cancer screening    [facility] Celestia Rosaline SQUIBB, NP   1 year ago Encounter to establish care    [facility] Celestia Rosaline SQUIBB, NP              Passed - Patient is not pregnant

## 2024-01-04 ENCOUNTER — Other Ambulatory Visit (INDEPENDENT_AMBULATORY_CARE_PROVIDER_SITE_OTHER): Payer: Self-pay | Admitting: Primary Care

## 2024-01-04 DIAGNOSIS — I10 Essential (primary) hypertension: Secondary | ICD-10-CM

## 2024-01-21 IMAGING — DX DG CHEST 1V PORT
1 series · 1 of 1 positions shown · non-contrast
Comparison: None.

CLINICAL DATA: Cough, chills.

EXAM:
PORTABLE CHEST 1 VIEW

[chest ap]
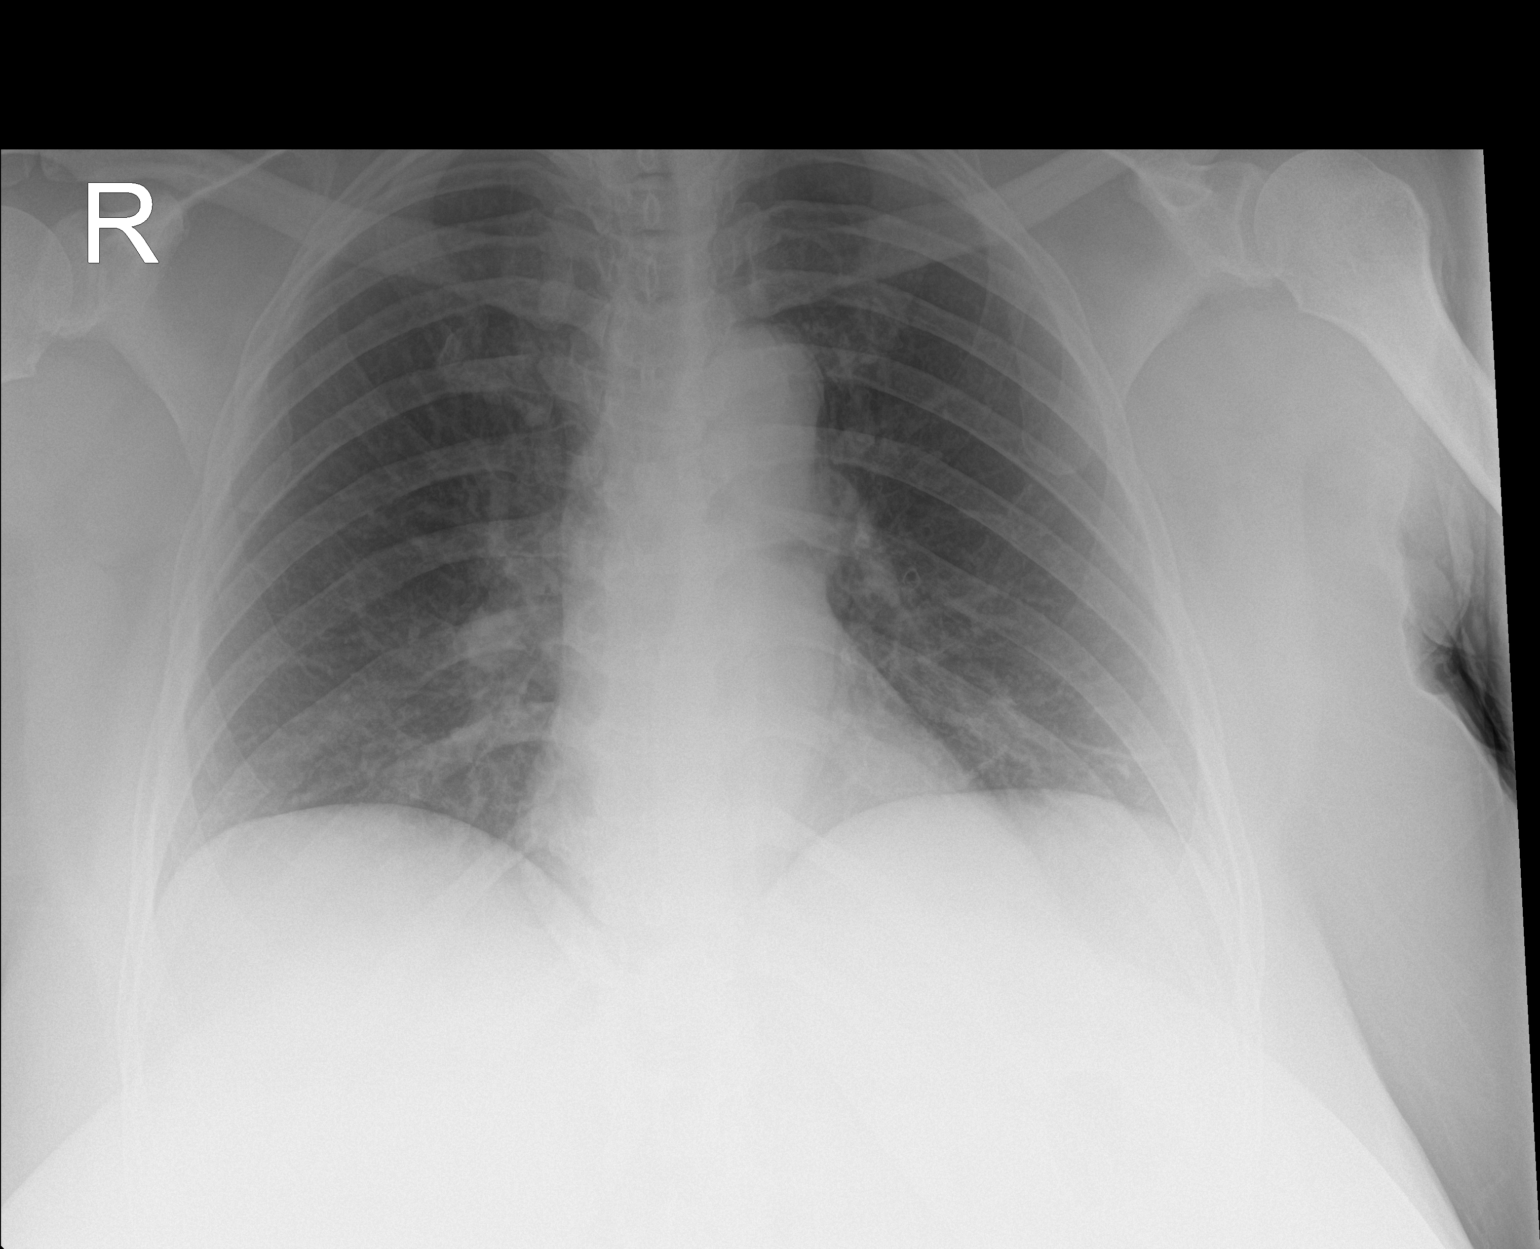

[1 of 1 positions shown; findings below may reference images not displayed]

FINDINGS: The heart size and mediastinal contours are within normal limits.
Lung volumes are low and mild subsegmental atelectasis is present at
the left lung base. No effusion or pneumothorax. No acute osseous
abnormality.
IMPRESSION: No active disease.
